# Patient Record
Sex: Female | Born: 1985 | Race: Black or African American | Hispanic: No | Marital: Single | Smoking: Light tobacco smoker
Health system: Southern US, Community
[De-identification: ages and names within clinical notes are randomized; demographics above are authoritative.]

## PROBLEM LIST (undated history)

## (undated) DIAGNOSIS — I10 Essential (primary) hypertension: Secondary | ICD-10-CM

## (undated) DIAGNOSIS — F32A Depression, unspecified: Secondary | ICD-10-CM

## (undated) DIAGNOSIS — F329 Major depressive disorder, single episode, unspecified: Secondary | ICD-10-CM

## (undated) DIAGNOSIS — F319 Bipolar disorder, unspecified: Secondary | ICD-10-CM

## (undated) DIAGNOSIS — K219 Gastro-esophageal reflux disease without esophagitis: Secondary | ICD-10-CM

---

## 2011-02-28 ENCOUNTER — Encounter (HOSPITAL_COMMUNITY): Payer: Self-pay | Admitting: Emergency Medicine

## 2011-02-28 ENCOUNTER — Emergency Department (HOSPITAL_COMMUNITY)
Admission: EM | Admit: 2011-02-28 | Discharge: 2011-02-28 | Disposition: A | Discharge status: Home / Self Care | Payer: Medicaid Other | Attending: Emergency Medicine | Admitting: Emergency Medicine

## 2011-02-28 DIAGNOSIS — N390 Urinary tract infection, site not specified: Secondary | ICD-10-CM

## 2011-02-28 DIAGNOSIS — R110 Nausea: Secondary | ICD-10-CM | POA: Insufficient documentation

## 2011-02-28 DIAGNOSIS — F17200 Nicotine dependence, unspecified, uncomplicated: Secondary | ICD-10-CM | POA: Insufficient documentation

## 2011-02-28 DIAGNOSIS — R1013 Epigastric pain: Secondary | ICD-10-CM | POA: Insufficient documentation

## 2011-02-28 LAB — URINALYSIS, ROUTINE W REFLEX MICROSCOPIC
Glucose, UA: NEGATIVE mg/dL
Ketones, ur: NEGATIVE mg/dL
Nitrite: POSITIVE — AB
Specific Gravity, Urine: 1.025 (ref 1.005–1.030)
pH: 7 (ref 5.0–8.0)

## 2011-02-28 LAB — URINE MICROSCOPIC-ADD ON

## 2011-02-28 LAB — POCT PREGNANCY, URINE: Preg Test, Ur: NEGATIVE

## 2011-02-28 MED ORDER — GI COCKTAIL ~~LOC~~
30.0000 mL | Freq: Once | ORAL | Status: AC
  Administered 2011-02-28: 30 mL via ORAL
  Filled 2011-02-28: qty 30

## 2011-02-28 MED ORDER — FAMOTIDINE 20 MG PO TABS
20.0000 mg | ORAL_TABLET | Freq: Once | ORAL | Status: AC
  Administered 2011-02-28: 20 mg via ORAL
  Filled 2011-02-28: qty 1

## 2011-02-28 MED ORDER — CEPHALEXIN 500 MG PO CAPS: 500.0000 mg | ORAL_CAPSULE | Freq: Four times a day (QID) | ORAL | Status: AC

## 2011-02-28 MED ORDER — NITROFURANTOIN MONOHYD MACRO 100 MG PO CAPS
100.0000 mg | ORAL_CAPSULE | ORAL | Status: AC
  Administered 2011-02-28: 100 mg via ORAL
  Filled 2011-02-28: qty 1

## 2011-02-28 MED ORDER — FAMOTIDINE 20 MG PO TABS: 20.0000 mg | ORAL_TABLET | Freq: Two times a day (BID) | ORAL | Status: DC

## 2011-02-28 MED ORDER — ONDANSETRON 4 MG PO TBDP
4.0000 mg | ORAL_TABLET | Freq: Once | ORAL | Status: AC
  Administered 2011-02-28: 4 mg via ORAL
  Filled 2011-02-28: qty 1

## 2011-02-28 NOTE — ED Notes (Signed)
Pt. Stated, I've had abd. Pain for a week.

## 2011-02-28 NOTE — Discharge Instructions (Signed)

## 2011-02-28 NOTE — ED Provider Notes (Signed)
History     CSN: [id number]  Arrival date & time 02/28/11  1310   First MD Initiated Contact with Patient 02/28/11 1358      Chief Complaint  Patient presents with  . Abdominal Pain    (Consider location/radiation/quality/duration/timing/severity/associated sxs/prior treatment) Patient is a 26 y.o. female presenting with abdominal pain. The history is provided by the patient. No language interpreter was used.  Abdominal Pain The primary symptoms of the illness include abdominal pain, nausea and dysuria. The primary symptoms of the illness do not include fever, fatigue, shortness of breath, vomiting, diarrhea, vaginal discharge or vaginal bleeding. The current episode started more than 2 days ago (1 week). The onset of the illness was gradual. The problem has been gradually worsening.  The pain came on gradually. The abdominal pain has been gradually worsening since its onset. The abdominal pain is located in the epigastric region and suprapubic region. The abdominal pain does not radiate. The abdominal pain is relieved by nothing.  Nausea began 6 to 7 days ago. The nausea is associated with eating. The nausea is exacerbated by food.  The dysuria began 6 to 7 days ago. The discomfort is moderate. The dysuria is associated with frequency. The dysuria is not associated with discharge, hematuria, urgency, dyspareunia or vaginal pain.  The patient states that she believes she is currently not pregnant. The patient has not had a change in bowel habit. Additional symptoms associated with the illness include frequency. Symptoms associated with the illness do not include constipation, urgency, hematuria or back pain.    History reviewed. No pertinent past medical history.  History reviewed. No pertinent past surgical history.  History reviewed. No pertinent family history.  History  Substance Use Topics  . Smoking status: Current Everyday Smoker    Types: Cigarettes  . Smokeless tobacco: Not  on file  . Alcohol Use: No    OB History    Grav Para Term Preterm Abortions TAB SAB Ect Mult Living                  Review of Systems  Constitutional: Negative for fever, activity change, appetite change and fatigue.  HENT: Negative for congestion, sore throat, rhinorrhea, neck pain and neck stiffness.   Respiratory: Negative for cough and shortness of breath.   Cardiovascular: Negative for chest pain and palpitations.  Gastrointestinal: Positive for nausea and abdominal pain. Negative for vomiting, diarrhea and constipation.  Genitourinary: Positive for dysuria and frequency. Negative for urgency, hematuria, flank pain, vaginal bleeding, vaginal discharge, vaginal pain and dyspareunia.  Musculoskeletal: Negative for myalgias, back pain and arthralgias.  Neurological: Negative for dizziness, weakness, light-headedness, numbness and headaches.  All other systems reviewed and are negative.    Allergies  Review of patient's allergies indicates no known allergies.  Home Medications   Current Outpatient Rx  Name Route Sig Dispense Refill  . CEPHALEXIN 500 MG PO CAPS Oral Take 1 capsule (500 mg total) by mouth 4 (four) times daily. 28 capsule 0  . FAMOTIDINE 20 MG PO TABS Oral Take 1 tablet (20 mg total) by mouth 2 (two) times daily. 30 tablet 0    BP 111/64  Pulse 94  Temp(Src) 98.3 F (36.8 C) (Oral)  Resp 16  SpO2 99%  LMP 12/06/2010  Physical Exam  Nursing note and vitals reviewed. Constitutional: She is oriented to person, place, and time. She appears well-developed and well-nourished. No distress.  HENT:  Head: Normocephalic and atraumatic.  Mouth/Throat: Oropharynx is clear and  moist. No oropharyngeal exudate.  Eyes: Conjunctivae and EOM are normal. Pupils are equal, round, and reactive to light.  Neck: Normal range of motion. Neck supple.  Cardiovascular: Normal rate, regular rhythm, normal heart sounds and intact distal pulses.  Exam reveals no gallop and no  friction rub.   No murmur heard. Pulmonary/Chest: Effort normal and breath sounds normal. No respiratory distress.  Abdominal: Soft. Bowel sounds are normal. There is tenderness (epigastric). There is no rebound and no guarding.       No cva tenderness  Musculoskeletal: Normal range of motion. She exhibits no tenderness.  Neurological: She is alert and oriented to person, place, and time. No cranial nerve deficit.  Skin: Skin is warm and dry. No rash noted.    ED Course  Procedures (including critical care time)  Labs Reviewed  URINALYSIS, ROUTINE W REFLEX MICROSCOPIC - Abnormal; Notable for the following:    APPearance CLOUDY (*)    Hgb urine dipstick SMALL (*)    Nitrite POSITIVE (*)    Leukocytes, UA MODERATE (*)    All other components within normal limits  URINE MICROSCOPIC-ADD ON - Abnormal; Notable for the following:    Bacteria, UA MANY (*)    All other components within normal limits  POCT PREGNANCY, URINE   No results found.   1. UTI (lower urinary tract infection)       MDM  Findings consistent with a urinary tract infection. She has some epigastric tenderness consistent with a gastritis type picture. There is no indication for further testing at this time. Symptoms are improved after medications in the emergency department. I chose Keflex after seeing the patient had no insurance. i feel this is the best antibiotic choice.        Dayton Bailiff, MD 02/28/11 [phone]

## 2013-08-17 ENCOUNTER — Emergency Department (HOSPITAL_COMMUNITY)
Admission: EM | Admit: 2013-08-17 | Discharge: 2013-08-17 | Discharge status: Against Medical Advice | Payer: Medicaid Other | Attending: Emergency Medicine | Admitting: Emergency Medicine

## 2013-08-17 ENCOUNTER — Encounter (HOSPITAL_COMMUNITY): Payer: Self-pay | Admitting: Emergency Medicine

## 2013-08-17 DIAGNOSIS — M545 Low back pain, unspecified: Secondary | ICD-10-CM | POA: Diagnosis not present

## 2013-08-17 DIAGNOSIS — I10 Essential (primary) hypertension: Secondary | ICD-10-CM | POA: Diagnosis not present

## 2013-08-17 DIAGNOSIS — F17200 Nicotine dependence, unspecified, uncomplicated: Secondary | ICD-10-CM | POA: Diagnosis not present

## 2013-08-17 DIAGNOSIS — R079 Chest pain, unspecified: Secondary | ICD-10-CM | POA: Insufficient documentation

## 2013-08-17 HISTORY — DX: Bipolar disorder, unspecified: F31.9

## 2013-08-17 HISTORY — DX: Essential (primary) hypertension: I10

## 2013-08-17 NOTE — ED Notes (Signed)
Pt cannot be found

## 2013-08-17 NOTE — ED Notes (Signed)
Patient states started having chest "tightness" on Tuesday and that it has remained constant since.  Patient states that the pain radiates from chest to L arm and low back.  Patient states that she took an ibuprofen yesterday and it seemed to ease up a little.

## 2016-10-04 ENCOUNTER — Emergency Department (HOSPITAL_COMMUNITY)
Admission: EM | Admit: 2016-10-04 | Discharge: 2016-10-04 | Disposition: A | Discharge status: Home / Self Care | Payer: Medicaid Other | Attending: Emergency Medicine | Admitting: Emergency Medicine

## 2016-10-04 ENCOUNTER — Encounter (HOSPITAL_COMMUNITY): Payer: Self-pay | Admitting: Emergency Medicine

## 2016-10-04 DIAGNOSIS — J039 Acute tonsillitis, unspecified: Secondary | ICD-10-CM | POA: Diagnosis not present

## 2016-10-04 DIAGNOSIS — I10 Essential (primary) hypertension: Secondary | ICD-10-CM | POA: Insufficient documentation

## 2016-10-04 DIAGNOSIS — F17210 Nicotine dependence, cigarettes, uncomplicated: Secondary | ICD-10-CM | POA: Insufficient documentation

## 2016-10-04 DIAGNOSIS — J029 Acute pharyngitis, unspecified: Secondary | ICD-10-CM | POA: Diagnosis present

## 2016-10-04 DIAGNOSIS — Z79899 Other long term (current) drug therapy: Secondary | ICD-10-CM | POA: Insufficient documentation

## 2016-10-04 LAB — RAPID STREP SCREEN (MED CTR MEBANE ONLY): Streptococcus, Group A Screen (Direct): NEGATIVE

## 2016-10-04 MED ORDER — DEXAMETHASONE SODIUM PHOSPHATE 10 MG/ML IJ SOLN
10.0000 mg | Freq: Once | INTRAMUSCULAR | Status: AC
  Administered 2016-10-04: 10 mg via INTRAMUSCULAR
  Filled 2016-10-04: qty 1

## 2016-10-04 MED ORDER — KETOROLAC TROMETHAMINE 15 MG/ML IJ SOLN
15.0000 mg | Freq: Once | INTRAMUSCULAR | Status: AC
  Administered 2016-10-04: 15 mg via INTRAMUSCULAR
  Filled 2016-10-04: qty 1

## 2016-10-04 MED ORDER — PENICILLIN V POTASSIUM 500 MG PO TABS: 500.0000 mg | ORAL_TABLET | Freq: Four times a day (QID) | ORAL | 0 refills | Status: AC

## 2016-10-04 NOTE — ED Triage Notes (Signed)
Pt reports sore throat x 2 days

## 2016-10-04 NOTE — Discharge Instructions (Signed)
Take antibiotics as prescribed. Take the entire course of antibiotics even if you are feeling better.  Use tylenol or ibuprofen as needed for fever or chills.  Stay well hydrated with water.  Return to the ER if you develop worsening symptoms, inability to swallow, or any new or concerning symptoms.

## 2016-10-04 NOTE — ED Notes (Signed)
Declined W/C at D/C and was escorted to lobby by RN. 

## 2016-10-04 NOTE — ED Provider Notes (Signed)
Pt seen and evaluated. D/W PA-c. Negative strep swab. Patient with bilateral exudative pharyngitis. She reports pain right greater than left. She is spitting. However she has no frank trismus. She has bilateral intracervical adenopathy. She does not have pain with side to side manipulation of the thyroid cartilage. She does not have a hot potato voice. She she is not dysphonic or worse. She has no posterior cervical nodes.  Her pain is asymmetric but her exam is not. I agree with empiric treatment with penicillin. Since back treatment with Toradol and Decadron. Otherwise Motrin, salt water gargles, and time. Recheck with worsening symptoms.   Tanna Furry, MD 10/04/16 [phone]

## 2016-10-04 NOTE — ED Provider Notes (Signed)
[facility] DEPT Provider Note   CSN: [id number] Arrival date & time: 10/04/16  1301     History   Chief Complaint Chief Complaint  Patient presents with  . Sore Throat    HPI Desiree Howell is a 31 y.o. female presenting with sore throat beginning yesterday.  She states that yesterday she started to develop right sided sore throat. This has worsened, is now on both sides of her throat. She states it hurts when she swallows and talks. She has associated right-sided ear pain. She denies fevers, chills, congestion, difficulty breathing, cough, chest pain, shortness of breath, nausea, vomiting, or abdominal pain. She Is not immunocompromised. She has no other medical history does not take medicines daily. She's not had recent antibiotic use. She denies sick contacts. She has not taken anything to try to improve her symptoms.   HPI  Past Medical History:  Diagnosis Date  . Bipolar 1 disorder ([place])   . Hypertension     There are no active problems to display for this patient.   Past Surgical History:  Procedure Laterality Date  . CESAREAN SECTION      OB History    No data available       Home Medications    Prior to Admission medications   Medication Sig Start Date End Date Taking? Authorizing Provider  famotidine (PEPCID) 20 MG tablet Take 1 tablet (20 mg total) by mouth 2 (two) times daily. 02/28/11 02/28/12  Trisha Mangle, MD  penicillin v potassium (VEETID) 500 MG tablet Take 1 tablet (500 mg total) by mouth 4 (four) times daily. 10/04/16 10/11/16  Abdulkarim Eberlin, PA-C    Family History No family history on file.  Social History Social History  Substance Use Topics  . Smoking status: Current Every Day Smoker    Packs/day: 0.25    Types: Cigarettes  . Smokeless tobacco: Not on file  . Alcohol use Yes     Allergies   Patient has no known allergies.   Review of Systems Review of Systems  Constitutional: Negative for chills and fever.  HENT:  Positive for ear discharge, sore throat and trouble swallowing (hurts, but able to do so). Negative for congestion, drooling, rhinorrhea, sinus pain, sinus pressure and voice change.   Eyes: Negative for pain and discharge.  Respiratory: Negative for cough, chest tightness and shortness of breath.   Cardiovascular: Negative for chest pain.  Gastrointestinal: Negative for abdominal pain, nausea and vomiting.     Physical Exam Updated Vital Signs BP 118/81 (BP Location: Left Arm)   Pulse 79   Temp 99.1 F (37.3 C) (Oral)   Resp 17   SpO2 100%   Physical Exam  Constitutional: She is oriented to person, place, and time. She appears well-developed and well-nourished. No distress.  HENT:  Head: Normocephalic and atraumatic.  Right Ear: Tympanic membrane, external ear and ear canal normal.  Left Ear: Tympanic membrane, external ear and ear canal normal.  Nose: Mucosal edema present.  Mouth/Throat: Uvula is midline and mucous membranes are normal. No trismus in the jaw. No uvula swelling. Tonsillar exudate.  Mild bilateral tonsillar swelling with exudates. Uvula is midline without deviation. Equal palatal rise. No pain under the tongue. Patient is able to swallow and handle secretions.  Eyes: Conjunctivae and EOM are normal. Right eye exhibits no discharge. Left eye exhibits no discharge.  Neck: Normal range of motion.  Cardiovascular: Normal rate, regular rhythm and intact distal pulses.   Pulmonary/Chest: Effort normal and breath sounds  normal. No respiratory distress. She has no wheezes. She exhibits no tenderness.  Patient speaking in full sentences without difficulty. Good air movement in all lung fields.  Abdominal: She exhibits no distension. There is no tenderness.  Musculoskeletal: Normal range of motion.  Lymphadenopathy:    She has cervical adenopathy.  Neurological: She is alert and oriented to person, place, and time.  Skin: Skin is warm. No rash noted.  Psychiatric: She has  a normal mood and affect.  Nursing note and vitals reviewed.    ED Treatments / Results  Labs (all labs ordered are listed, but only abnormal results are displayed) Labs Reviewed  RAPID STREP SCREEN (NOT AT [facility])  CULTURE, GROUP A STREP [facility])    EKG  EKG Interpretation None       Radiology No results found.  Procedures Procedures (including critical care time)  Medications Ordered in ED Medications  dexamethasone (DECADRON) injection 10 mg (10 mg Intramuscular Given 10/04/16 1540)  ketorolac (TORADOL) 15 MG/ML injection 15 mg (15 mg Intramuscular Given 10/04/16 1539)     Initial Impression / Assessment and Plan / ED Course  I have reviewed the triage vital signs and the nursing notes.  Pertinent labs & imaging results that were available during my care of the patient were reviewed by me and considered in my medical decision making (see chart for details).     Patient presenting with one-day history of sore throat. Patient reports she's having difficulty swallowing, but is able to do so. Physical exam shows patient is afebrile not tachycardic. Bilateral tonsillar swelling and patchy exudates without uvula deviation or unequal palate rise. No obvious peritonsillar abscess. Patient speaking in full sentences without difficulty, no signs of airway compromise. Will treat with Decadron and Toradol shots today, and antibiotic prescription. Strep negative. Case discussed with attending, Dr. Jeneen Rinks evaluated the patient Return precautions given. Patient states she understands and agrees to plan.  Patient developed localized swelling after administration of Toradol shot. Ultrasound showed likely hematoma. Ice was applied, patient reported improvement in pain. She denies difficulty breathing, itching or pain elsewhere, or swelling of her tongue, lips, or throat. She is still neurovascularly intact. Dr. Jeneen Rinks re-evaluated the patient. Will observe and reassess.   On reassessment,  patient states her shoulder is improved with ice. Hematoma still present, it has not increased in size. Pt states that her breathing and sore throat is much improved with Decadron. She reports no difficulty swallowing at this time. She continues to deny lip, tongue, or throat swelling. Discussed likely course of shoulder pain and bruising over the next several weeks. Discussed patient to continue to apply ice for symptom control. Return precautions given. Patient states she understands.   Final Clinical Impressions(s) / ED Diagnoses   Final diagnoses:  Tonsillitis    New Prescriptions Discharge Medication List as of 10/04/2016  3:32 PM    START taking these medications   Details  penicillin v potassium (VEETID) 500 MG tablet Take 1 tablet (500 mg total) by mouth 4 (four) times daily., Starting Sun 10/04/2016, Until Sun 10/11/2016, Print         Christean Silvestri, PA-C 10/04/16 1750    Tanna Furry, MD 10/12/16 [phone]

## 2016-10-06 LAB — CULTURE, GROUP A STREP (THRC)

## 2016-10-18 ENCOUNTER — Encounter (HOSPITAL_COMMUNITY): Payer: Self-pay

## 2016-10-18 ENCOUNTER — Emergency Department (HOSPITAL_COMMUNITY)
Admission: EM | Admit: 2016-10-18 | Discharge: 2016-10-18 | Disposition: A | Discharge status: Home / Self Care | Payer: Medicaid Other | Attending: Emergency Medicine | Admitting: Emergency Medicine

## 2016-10-18 ENCOUNTER — Emergency Department (HOSPITAL_COMMUNITY): Payer: Medicaid Other

## 2016-10-18 DIAGNOSIS — F17210 Nicotine dependence, cigarettes, uncomplicated: Secondary | ICD-10-CM | POA: Diagnosis not present

## 2016-10-18 DIAGNOSIS — M7591 Shoulder lesion, unspecified, right shoulder: Secondary | ICD-10-CM | POA: Insufficient documentation

## 2016-10-18 DIAGNOSIS — I10 Essential (primary) hypertension: Secondary | ICD-10-CM | POA: Diagnosis not present

## 2016-10-18 DIAGNOSIS — M25511 Pain in right shoulder: Secondary | ICD-10-CM | POA: Diagnosis present

## 2016-10-18 LAB — COMPREHENSIVE METABOLIC PANEL
ALK PHOS: 53 U/L (ref 38–126)
ALT: 16 U/L (ref 14–54)
ANION GAP: 6 (ref 5–15)
AST: 17 U/L (ref 15–41)
Albumin: 3.3 g/dL — ABNORMAL LOW (ref 3.5–5.0)
BUN: 11 mg/dL (ref 6–20)
CO2: 25 mmol/L (ref 22–32)
Calcium: 8.6 mg/dL — ABNORMAL LOW (ref 8.9–10.3)
Chloride: 104 mmol/L (ref 101–111)
Creatinine, Ser: 0.61 mg/dL (ref 0.44–1.00)
GLUCOSE: 121 mg/dL — AB (ref 65–99)
Potassium: 3.9 mmol/L (ref 3.5–5.1)
SODIUM: 135 mmol/L (ref 135–145)
Total Bilirubin: 0.7 mg/dL (ref 0.3–1.2)
Total Protein: 6.5 g/dL (ref 6.5–8.1)

## 2016-10-18 LAB — I-STAT BETA HCG BLOOD, ED (MC, WL, AP ONLY)

## 2016-10-18 LAB — CBC WITH DIFFERENTIAL/PLATELET
Basophils Absolute: 0 10*3/uL (ref 0.0–0.1)
Basophils Relative: 0 %
Eosinophils Absolute: 0.2 10*3/uL (ref 0.0–0.7)
Eosinophils Relative: 3 %
HEMATOCRIT: 38.8 % (ref 36.0–46.0)
Hemoglobin: 12.8 g/dL (ref 12.0–15.0)
LYMPHS PCT: 39 %
Lymphs Abs: 2.4 10*3/uL (ref 0.7–4.0)
MCH: 31.7 pg (ref 26.0–34.0)
MCHC: 33 g/dL (ref 30.0–36.0)
MCV: 96 fL (ref 78.0–100.0)
MONO ABS: 0.4 10*3/uL (ref 0.1–1.0)
MONOS PCT: 6 %
NEUTROS ABS: 3.1 10*3/uL (ref 1.7–7.7)
Neutrophils Relative %: 52 %
Platelets: 458 10*3/uL — ABNORMAL HIGH (ref 150–400)
RBC: 4.04 MIL/uL (ref 3.87–5.11)
RDW: 12.4 % (ref 11.5–15.5)
WBC: 6 10*3/uL (ref 4.0–10.5)

## 2016-10-18 LAB — I-STAT CG4 LACTIC ACID, ED
LACTIC ACID, VENOUS: 1.07 mmol/L (ref 0.5–1.9)
Lactic Acid, Venous: 0.82 mmol/L (ref 0.5–1.9)

## 2016-10-18 MED ORDER — TRAMADOL HCL 50 MG PO TABS: 50.0000 mg | ORAL_TABLET | Freq: Four times a day (QID) | ORAL | 0 refills | Status: DC | PRN

## 2016-10-18 MED ORDER — TRAMADOL HCL 50 MG PO TABS
50.0000 mg | ORAL_TABLET | Freq: Once | ORAL | Status: AC
  Administered 2016-10-18: 50 mg via ORAL
  Filled 2016-10-18: qty 1

## 2016-10-18 NOTE — ED Triage Notes (Signed)
Patient complains of right shoulder swelling with pain x several days. States that this got much worse following injections about 1-2 weeks ago in ED. Large knot noted to right shoulder, pain with any ROM

## 2016-10-18 NOTE — ED Provider Notes (Signed)
[facility] DEPT Provider Note   CSN: [id number] Arrival date & time: 10/18/16  1233     History   Chief Complaint Chief Complaint  Patient presents with  . shoulder swelling, abd. pain    HPI Nikyah Howell is a 31 y.o. female.  HPI Patient presents with her pain and swelling in her right shoulder. She notes that she has had pain in the area since falling off truck several years ago. However, over the past days in particular pain has become worse, and there is a possible increase in size of the right shoulder lesion. Pain is worse with any motion of the arm, radiates down the arm. Patient is minimally improved with Tylenol. No distal loss of sensation or strength. No neck pain, no headache, fever, no chills, no skin color changes. Patient was seen here within the past month, treated for strep throat.  Past Medical History:  Diagnosis Date  . Bipolar 1 disorder ([place])   . Hypertension     There are no active problems to display for this patient.   Past Surgical History:  Procedure Laterality Date  . CESAREAN SECTION      OB History    No data available       Home Medications    Prior to Admission medications   Medication Sig Start Date End Date Taking? Authorizing Provider  famotidine (PEPCID) 20 MG tablet Take 1 tablet (20 mg total) by mouth 2 (two) times daily. Patient not taking: Reported on 10/18/2016 02/28/11 10/18/16  Trisha Mangle, MD    Family History No family history on file.  Social History Social History  Substance Use Topics  . Smoking status: Current Every Day Smoker    Packs/day: 0.25    Types: Cigarettes  . Smokeless tobacco: Not on file  . Alcohol use Yes     Allergies   Patient has no known allergies.   Review of Systems Review of Systems  Constitutional:       Per HPI, otherwise negative  HENT:       Per HPI, otherwise negative  Respiratory:       Per HPI, otherwise negative  Cardiovascular:       Per HPI,  otherwise negative  Gastrointestinal: Negative for vomiting.  Endocrine:       Negative aside from HPI  Genitourinary:       Neg aside from HPI   Musculoskeletal:       Per HPI, otherwise negative  Skin: Negative.   Neurological: Negative for syncope.     Physical Exam Updated Vital Signs BP 133/88 (BP Location: Left Arm)   Pulse 79   Temp 98.7 F (37.1 C) (Oral)   Resp 17   SpO2 100%   Physical Exam  Constitutional: She is oriented to person, place, and time. She appears well-developed and well-nourished. No distress.  HENT:  Head: Normocephalic and atraumatic.  Eyes: Conjunctivae and EOM are normal.  Cardiovascular: Normal rate and regular rhythm.   Pulmonary/Chest: Effort normal and breath sounds normal. No stridor. No respiratory distress.  Abdominal: She exhibits no distension.  Musculoskeletal: She exhibits no edema.       Right elbow: Normal.      Right wrist: Normal.       Arms: Neurological: She is alert and oriented to person, place, and time. No cranial nerve deficit.  Skin: Skin is warm and dry.  Psychiatric: She has a normal mood and affect.  Nursing note and vitals reviewed.  ED Treatments / Results  Labs (all labs ordered are listed, but only abnormal results are displayed) Labs Reviewed  COMPREHENSIVE METABOLIC PANEL - Abnormal; Notable for the following:       Result Value   Glucose, Bld 121 (*)    Calcium 8.6 (*)    Albumin 3.3 (*)    All other components within normal limits  CBC WITH DIFFERENTIAL/PLATELET - Abnormal; Notable for the following:    Platelets 458 (*)    All other components within normal limits  I-STAT CG4 LACTIC ACID, ED  POC URINE PREG, ED  I-STAT BETA HCG BLOOD, ED ([facility], [facility], [facility] ONLY)     Radiology Ct Shoulder Right Wo Contrast  Result Date: 10/18/2016 CLINICAL DATA:  Palpable right shoulder mass. EXAM: CT OF THE UPPER RIGHT EXTREMITY WITHOUT CONTRAST TECHNIQUE: Multidetector CT imaging of the upper right extremity  was performed according to the standard protocol. COMPARISON:  None. FINDINGS: Bones/Joint/Cartilage Normal osseous mineralization. Corticated curvilinear lucency through the distal acromion. Ligaments Suboptimally assessed by CT. Muscles and Tendons Grossly intact. Soft tissues There is a fat density circumscribed mass, which corresponds to the area of palpable concern adjacent to the right acromioclavicular joint which measures 7.5 by 7.9 by 7.4 cm. No pathologically enlarged by CT criteria lymph nodes. IMPRESSION: Corticated curvilinear lucency through the distal acromion may represent a chronic nonunion fracture or a congenital variant. The area of palpable concern superficial to the acromioclavicular joint corresponds to a 7.9 cm fat density circumscribed mass. Differential diagnosis includes lipoma versus liposarcoma. Electronically Signed   By: Fidela Salisbury M.D.   On: 10/18/2016 18:19    Procedures Procedures (including critical care time)  Medications Ordered in ED Medications  traMADol (ULTRAM) tablet 50 mg (not administered)     Initial Impression / Assessment and Plan / ED Course  I have reviewed the triage vital signs and the nursing notes.  Pertinent labs & imaging results that were available during my care of the patient were reviewed by me and considered in my medical decision making (see chart for details).   Chart review performed notable for recent evaluation, treatment for strep throat.  6:47 PM Patient awake and alert, in no distress. I discussed all findings including CT results with the patient.  With no evidence for intra-articular issue, with suspicion for lipoma versus liposarcoma, I discussed importance of following up with orthopedist. With otherwise reassuring physical exam, vitals, labs, patient is appropriate for outpatient follow-up.  Final Clinical Impressions(s) / ED Diagnoses  Shoulder lesion, right, initial encounter   Carmin Muskrat,  MD 10/18/16 [phone]

## 2016-10-18 NOTE — Discharge Instructions (Signed)
As discussed, today's evaluation has been generally reassuring. The lesion on your shoulder is likely a lipoma, though additional evaluation is necessary. His important that you follow-up with our orthopedic colleagues.  Monitor your condition carefully, and do not hesitate to return here if you develop new, or concerning changes in your condition.

## 2016-10-20 ENCOUNTER — Telehealth: Payer: Self-pay

## 2016-10-20 NOTE — Telephone Encounter (Signed)
Patient made appointment to be seen.

## 2016-10-22 ENCOUNTER — Ambulatory Visit (INDEPENDENT_AMBULATORY_CARE_PROVIDER_SITE_OTHER): Payer: Medicaid Other | Admitting: Orthopaedic Surgery

## 2016-10-22 ENCOUNTER — Encounter (INDEPENDENT_AMBULATORY_CARE_PROVIDER_SITE_OTHER): Payer: Self-pay | Admitting: Orthopaedic Surgery

## 2016-10-22 VITALS — BP 108/71 | HR 70 | Temp 98.3°F | Resp 14 | Ht 61.0 in | Wt 236.0 lb

## 2016-10-22 DIAGNOSIS — M25511 Pain in right shoulder: Secondary | ICD-10-CM

## 2016-10-22 DIAGNOSIS — G8929 Other chronic pain: Secondary | ICD-10-CM | POA: Diagnosis not present

## 2016-10-22 NOTE — Progress Notes (Deleted)
   Office Visit Note   Patient: Desiree Howell           Date of Birth: 14-Jan-1985           MRN: [id number] Visit Date: 10/22/2016              Requested by: No referring provider defined for this encounter. PCP: Patient, No Pcp Per   Assessment & Plan: Visit Diagnoses: No diagnosis found.  Plan: ***  Follow-Up Instructions: No Follow-up on file.   Orders:  No orders of the defined types were placed in this encounter.  No orders of the defined types were placed in this encounter.     Procedures: No procedures performed   Clinical Data: No additional findings.   Subjective: Chief Complaint  Patient presents with  . Right Shoulder - Pain, Edema    [name] is a 31 y o that presents with Right shoulder pain. At age 31, she fell off back of truck with R elbow dislocated, reduced by grandparent.2 yrs. ago She noticed a small "bum" on the shoulder. Currently the shoulder is ready, swollen and painful    HPI  Review of Systems  Constitutional: Negative for chills, fatigue and fever.  Eyes: Negative for itching.  Respiratory: Negative for chest tightness and shortness of breath.   Cardiovascular: Negative for chest pain, palpitations and leg swelling.  Gastrointestinal: Negative for blood in stool, constipation and diarrhea.  Endocrine: Negative for polyuria.  Genitourinary: Negative for dysuria.  Musculoskeletal: Positive for neck pain. Negative for back pain and joint swelling.  Allergic/Immunologic: Negative for immunocompromised state.  Neurological: Negative for dizziness and numbness.  Hematological: Does not bruise/bleed easily.  Psychiatric/Behavioral: The patient is not nervous/anxious.      Objective: Vital Signs: BP 108/71   Pulse 70   Resp 14   Ht 5\' 1"  (1.549 m)   Wt 236 lb (107 kg)   LMP 10/07/2016   BMI 44.59 kg/m   Physical Exam  Ortho Exam  Specialty Comments:  No specialty comments available.  Imaging: No results found.   PMFS  History: There are no active problems to display for this patient.  Past Medical History:  Diagnosis Date  . Bipolar 1 disorder ([place])   . Hypertension     No family history on file.  Past Surgical History:  Procedure Laterality Date  . CESAREAN SECTION     Social History   Occupational History  . Not on file.   Social History Main Topics  . Smoking status: Light Tobacco Smoker    Types: Cigarettes  . Smokeless tobacco: Never Used  . Alcohol use Yes  . Drug use: No  . Sexual activity: Not on file

## 2016-10-22 NOTE — Progress Notes (Signed)
Office Visit Note   Patient: Desiree Howell           Date of Birth: June 19, 1985           MRN: [id number] Visit Date: 10/22/2016              Requested by: No referring provider defined for this encounter. PCP: Patient, No Pcp Per   Assessment & Plan: Visit Diagnoses:  1. Chronic right shoulder pain     Plan:Long discussion with Desiree Howell regarding the mass of the right shoulder and diagnostic possibilities. We will schedule an excisional biopsy of mass at [facility] operating [facility]. I discussed the outpatient nature the surgery, the incision..Period Of immobilization and necessary time out of work.  Follow-Up Instructions: Return will schedule excision mass right shoulder.   Orders:  No orders of the defined types were placed in this encounter.  No orders of the defined types were placed in this encounter.     Procedures: No procedures performed   Clinical Data: No additional findings.   Subjective: Chief Complaint  Patient presents with  . Right Shoulder - Pain, Edema    Desiree Howell is a 31 y o that presents with Right shoulder pain. At age 40, she fell off back of truck with R elbow dislocated, reduced by grandparent.2 yrs. ago She noticed a small "bum" on the shoulder. Currently the shoulder is ready, swollen and painful  Desiree Howell relates a progressively enlarging mass of the right shoulder over the past 2 years. She notes having had an injury to her shoulder 20 years ago and is not sure if that is at all related she apparently dislocated her elbow and injured her shoulder at that time that all of her injuries resolved without a further problems. She began to experience painful mass in the anterior aspect superior aspect of her right shoulder several years ago notes it has slowly enlarged. She was recently seen in the emergency room with a CT scan measuring 7.5 x 7.4 cm mass superior to the acromium and clipped distal clavicle the right shoulder. The differential  diagnosis included lipoma versus liposarcoma. The mass is well encapsulated. There were no other signs of malignancy. He is had difficulty sleeping on her shoulder and of course there is a concern about the appearance. Occasionally experiences some tingling in her hand. She's had 5 prior pregnancies and notes never having problems with numbness during any of those pregnancies. Denies knee problems on the left. Not having any neck problems  HPI  Review of Systems   Objective: Vital Signs: BP 108/71   Pulse 70   Temp 98.3 F (36.8 C)   Resp 14   Ht 5\' 1"  (1.549 m)   Wt 236 lb (107 kg)   LMP 10/07/2016   BMI 44.59 kg/m   Physical Exam  Ortho Exam awake alert and oriented 3 comfortable sitting in very pleasant. Large mass in the dorsum of her right shoulder that measures about 8-9 cm in diameter. It is firm. It didn't appear to be the least bit fluctuant. There is no change. Neurovascular exam appears to be intact. No neck pain. The mass is freely mobile. There is some tenderness. No impingement. Biceps intact  Specialty Comments:  No specialty comments available.  Imaging: No results found.   PMFS History: There are no active problems to display for this patient.  Past Medical History:  Diagnosis Date  . Bipolar 1 disorder ([place])   . Hypertension  History reviewed. No pertinent family history.  Past Surgical History:  Procedure Laterality Date  . CESAREAN SECTION     Social History   Occupational History  . Not on file.   Social History Main Topics  . Smoking status: Light Tobacco Smoker    Types: Cigarettes  . Smokeless tobacco: Never Used  . Alcohol use Yes  . Drug use: No  . Sexual activity: Not on file     Garald Balding, MD   Note - This record has been created using [employer].  Chart creation errors have been sought, but may not always  have been located. Such creation errors do not reflect on  the standard of medical care.

## 2016-11-04 NOTE — H&P (Signed)
Desiree Fears, MD   Biagio Borg, PA-C [address] [place], [place]  [address]                             [phone]   [place] MRN:  [id number] DOB/SEX:  Jun 07, 1985/female  CHIEF COMPLAINT:  Painful right shoulder mass  HISTORY: Desiree Howell relates a progressively enlarging mass of the right shoulder over the past 2 years. She notes having had an injury to her shoulder 20 years ago and is not sure if that is at all related she apparently dislocated her elbow and injured her shoulder at that time that all of her injuries resolved without a further problems. She began to experience painful mass in the anterior aspect superior aspect of her right shoulder several years ago notes it has slowly enlarged. She was recently seen in the emergency room with a CT scan measuring 7.5 x 7.4 cm mass superior to the acromium and clipped distal clavicle the right shoulder. The differential diagnosis included lipoma versus liposarcoma. The mass is well encapsulated. There were no other signs of malignancy. He is had difficulty sleeping on her shoulder and of course there is a concern about the appearance. Occasionally experiences some tingling in her hand.   PAST MEDICAL HISTORY: There are no active problems to display for this patient.  Past Medical History:  Diagnosis Date  . Bipolar 1 disorder ([place])   . Hypertension    Past Surgical History:  Procedure Laterality Date  . CESAREAN SECTION       MEDICATIONS:   No prescriptions prior to admission.    ALLERGIES:  No Known Allergies  REVIEW OF SYSTEMS:  ROS   FAMILY HISTORY:  No family history on file.  SOCIAL HISTORY:   Social History  Substance Use Topics  . Smoking status: Light Tobacco Smoker    Types: Cigarettes  . Smokeless tobacco: Never Used  . Alcohol use Yes      EXAMINATION: Vital signs in last 24 hours:    Head is normocephalic.   Eyes:  Pupils equal, round and  reactive to light and accommodation.  Extraocular intact. ENT: Ears, nose, and throat were benign.   Neck: supple, no bruits were noted.   Chest: good expansion.   Lungs: essentially clear.   Cardiac: regular rhythm and rate, normal S1, S2.  No murmurs appreciated. Pulses :  2+ bilateral and symmetric in upper extremities. Abdomen is scaphoid, soft, nontender, no masses palpable, normal bowel sounds present. CNS:  He is oriented x3 and cranial nerves II-XII grossly intact. Breast, rectal, and genital exams: not performed and not indicated for an orthopedic evaluation. Musculoskeletal:  Large mass in the dorsum of her right shoulder that measures about 8-9 cm in diameter. It is firm. It didn't appear to be the least bit fluctuant   Imaging Review CT OF THE UPPER RIGHT EXTREMITY WITHOUT CONTRAST  TECHNIQUE: Multidetector CT imaging of the upper right extremity was performed according to the standard protocol.  COMPARISON:  None.  FINDINGS: Bones/Joint/Cartilage  Normal osseous mineralization. Corticated curvilinear lucency through the distal acromion.  Ligaments  Suboptimally assessed by CT.  Muscles and Tendons  Grossly intact.  Soft tissues  There is a fat density circumscribed mass, which corresponds to the area of palpable concern adjacent to the right acromioclavicular joint which measures 7.5 by 7.9 by 7.4 cm. No pathologically enlarged by CT criteria lymph  nodes.  IMPRESSION: Corticated curvilinear lucency through the distal acromion may represent a chronic nonunion fracture or a congenital variant.  The area of palpable concern superficial to the acromioclavicular joint corresponds to a 7.9 cm fat density circumscribed mass. Differential diagnosis includes lipoma versus liposarcoma.  ASSESSMENT: right shoulder mass  Past Medical History:  Diagnosis Date  . Bipolar 1 disorder ([place])   . Hypertension     PLAN: Plan for right mass excision  and biopsy  The procedure,  risks, and benefits of surgery were presented and reviewed. The risks including but not limited to infection, blood clots, vascular and nerve injury, stiffness,  among others were discussed. The patient acknowledged the explanation, agreed to proceed.   Mike Craze [place], [place] [phone]  11/04/2016 5:03 PM

## 2016-11-06 NOTE — Pre-Procedure Instructions (Signed)
Desiree Howell  [id number]      [id number] [address], [place] [address] [place] [address] Phone: [phone] Fax: [phone]    Your procedure is scheduled on November 6  Report to [facility] Admitting at 0800 A.M.  Call this number if you have problems the morning of surgery:  [phone]   Remember:  Do not eat food or drink liquids after midnight.  Continue all other medications as directed by your physician except follow these medication instructions before surgery   Take these medicines the morning of surgery with A SIP OF WATER  acetaminophen (TYLENOL)  7 days prior to surgery STOP taking any Aspirin (unless otherwise instructed by your surgeon), Aleve, Naproxen, Ibuprofen, Motrin, Advil, Goody's, BC's, all herbal medications, fish oil, and all vitamins    Do not wear jewelry, make-up or nail polish.  Do not wear lotions, powders, or perfumes, or deoderant.  Do not shave 48 hours prior to surgery.    Do not bring valuables to the hospital.  [facility] is not responsible for any belongings or valuables.  Contacts, dentures or bridgework may not be worn into surgery.  Leave your suitcase in the car.  After surgery it may be brought to your room.  For patients admitted to the hospital, discharge time will be determined by your treatment team.  Patients discharged the day of surgery will not be allowed to drive home.    Special instructions:   [facility]- Preparing For Surgery  Before surgery, you can play an important role. Because skin is not sterile, your skin needs to be as free of germs as possible. You can reduce the number of germs on your skin by washing with CHG (chlorahexidine gluconate) Soap before surgery.  CHG is an antiseptic cleaner which kills germs and bonds with the skin to continue killing germs even after washing.  Please do not use if you have an allergy to CHG or  antibacterial soaps. If your skin becomes reddened/irritated stop using the CHG.  Do not shave (including legs and underarms) for at least 48 hours prior to first CHG shower. It is OK to shave your face.  Please follow these instructions carefully.   1. Shower the NIGHT BEFORE SURGERY and the MORNING OF SURGERY with CHG.   2. If you chose to wash your hair, wash your hair first as usual with your normal shampoo.  3. After you shampoo, rinse your hair and body thoroughly to remove the shampoo.  4. Use CHG as you would any other liquid soap. You can apply CHG directly to the skin and wash gently with a scrungie or a clean washcloth.   5. Apply the CHG Soap to your body ONLY FROM THE NECK DOWN.  Do not use on open wounds or open sores. Avoid contact with your eyes, ears, mouth and genitals (private parts). Wash Face and genitals (private parts)  with your normal soap.  6. Wash thoroughly, paying special attention to the area where your surgery will be performed.  7. Thoroughly rinse your body with warm water from the neck down.  8. DO NOT shower/wash with your normal soap after using and rinsing off the CHG Soap.  9. Pat yourself dry with a CLEAN TOWEL.  10. Wear CLEAN PAJAMAS to bed the night before surgery, wear comfortable clothes the morning of surgery  11. Place CLEAN SHEETS on your bed the night of your first shower  and DO NOT SLEEP WITH PETS.    Day of Surgery: Do not apply any deodorants/lotions. Please wear clean clothes to the hospital/surgery center.      Please read over the following fact sheets that you were given.

## 2016-11-09 ENCOUNTER — Encounter (HOSPITAL_COMMUNITY): Payer: Self-pay

## 2016-11-09 ENCOUNTER — Encounter (HOSPITAL_COMMUNITY)
Admission: RE | Admit: 2016-11-09 | Discharge: 2016-11-09 | Disposition: A | Discharge status: Home / Self Care | Payer: Medicaid Other | Source: Ambulatory Visit | Attending: Orthopaedic Surgery | Admitting: Orthopaedic Surgery

## 2016-11-09 DIAGNOSIS — Z01812 Encounter for preprocedural laboratory examination: Secondary | ICD-10-CM | POA: Insufficient documentation

## 2016-11-09 DIAGNOSIS — Z9889 Other specified postprocedural states: Secondary | ICD-10-CM | POA: Diagnosis not present

## 2016-11-09 DIAGNOSIS — F319 Bipolar disorder, unspecified: Secondary | ICD-10-CM | POA: Diagnosis not present

## 2016-11-09 DIAGNOSIS — F17210 Nicotine dependence, cigarettes, uncomplicated: Secondary | ICD-10-CM | POA: Insufficient documentation

## 2016-11-09 DIAGNOSIS — M25811 Other specified joint disorders, right shoulder: Secondary | ICD-10-CM | POA: Diagnosis not present

## 2016-11-09 DIAGNOSIS — I10 Essential (primary) hypertension: Secondary | ICD-10-CM | POA: Insufficient documentation

## 2016-11-09 HISTORY — DX: Gastro-esophageal reflux disease without esophagitis: K21.9

## 2016-11-09 HISTORY — DX: Major depressive disorder, single episode, unspecified: F32.9

## 2016-11-09 HISTORY — DX: Depression, unspecified: F32.A

## 2016-11-09 LAB — HCG, SERUM, QUALITATIVE: PREG SERUM: NEGATIVE

## 2016-11-09 LAB — CBC
HEMATOCRIT: 36.3 % (ref 36.0–46.0)
HEMOGLOBIN: 12 g/dL (ref 12.0–15.0)
MCH: 32.3 pg (ref 26.0–34.0)
MCHC: 33.1 g/dL (ref 30.0–36.0)
MCV: 97.6 fL (ref 78.0–100.0)
Platelets: 407 10*3/uL — ABNORMAL HIGH (ref 150–400)
RBC: 3.72 MIL/uL — AB (ref 3.87–5.11)
RDW: 12.8 % (ref 11.5–15.5)
WBC: 7.8 10*3/uL (ref 4.0–10.5)

## 2016-11-10 ENCOUNTER — Encounter (HOSPITAL_COMMUNITY): Payer: Self-pay | Admitting: Surgery

## 2016-11-10 ENCOUNTER — Ambulatory Visit (HOSPITAL_COMMUNITY): Payer: Medicaid Other | Admitting: Certified Registered"

## 2016-11-10 ENCOUNTER — Encounter (HOSPITAL_COMMUNITY)
Admission: RE | Disposition: A | Discharge status: Home / Self Care | Payer: Self-pay | Source: Ambulatory Visit | Attending: Orthopaedic Surgery

## 2016-11-10 ENCOUNTER — Other Ambulatory Visit: Payer: Self-pay

## 2016-11-10 ENCOUNTER — Ambulatory Visit (HOSPITAL_COMMUNITY)
Admission: RE | Admit: 2016-11-10 | Discharge: 2016-11-10 | Disposition: A | Discharge status: Home / Self Care | Payer: Medicaid Other | Source: Ambulatory Visit | Attending: Orthopaedic Surgery | Admitting: Orthopaedic Surgery

## 2016-11-10 DIAGNOSIS — R2230 Localized swelling, mass and lump, unspecified upper limb: Secondary | ICD-10-CM | POA: Diagnosis present

## 2016-11-10 DIAGNOSIS — R202 Paresthesia of skin: Secondary | ICD-10-CM | POA: Insufficient documentation

## 2016-11-10 DIAGNOSIS — D1721 Benign lipomatous neoplasm of skin and subcutaneous tissue of right arm: Secondary | ICD-10-CM | POA: Diagnosis present

## 2016-11-10 DIAGNOSIS — R2231 Localized swelling, mass and lump, right upper limb: Secondary | ICD-10-CM | POA: Diagnosis present

## 2016-11-10 DIAGNOSIS — F17210 Nicotine dependence, cigarettes, uncomplicated: Secondary | ICD-10-CM | POA: Insufficient documentation

## 2016-11-10 DIAGNOSIS — I10 Essential (primary) hypertension: Secondary | ICD-10-CM | POA: Diagnosis not present

## 2016-11-10 DIAGNOSIS — F319 Bipolar disorder, unspecified: Secondary | ICD-10-CM | POA: Diagnosis not present

## 2016-11-10 HISTORY — PX: EXCISION MASS UPPER EXTREMETIES: SHX6704

## 2016-11-10 SURGERY — EXCISION MASS UPPER EXTREMITIES
Anesthesia: Regional | Site: Shoulder | Laterality: Right

## 2016-11-10 MED ORDER — LACTATED RINGERS IV SOLN
INTRAVENOUS | Status: DC
  Administered 2016-11-10 (×2): via INTRAVENOUS

## 2016-11-10 MED ORDER — DIPHENHYDRAMINE HCL 50 MG/ML IJ SOLN
INTRAMUSCULAR | Status: DC | PRN
  Administered 2016-11-10: 25 mg via INTRAVENOUS

## 2016-11-10 MED ORDER — SODIUM CHLORIDE 0.9 % IR SOLN
Status: DC | PRN
  Administered 2016-11-10: 1000 mL

## 2016-11-10 MED ORDER — MIDAZOLAM HCL 2 MG/2ML IJ SOLN
INTRAMUSCULAR | Status: AC
  Filled 2016-11-10: qty 2

## 2016-11-10 MED ORDER — PHENYLEPHRINE 40 MCG/ML (10ML) SYRINGE FOR IV PUSH (FOR BLOOD PRESSURE SUPPORT)
PREFILLED_SYRINGE | INTRAVENOUS | Status: AC
  Filled 2016-11-10: qty 10

## 2016-11-10 MED ORDER — PROPOFOL 10 MG/ML IV BOLUS
INTRAVENOUS | Status: DC | PRN
  Administered 2016-11-10: 200 mg via INTRAVENOUS

## 2016-11-10 MED ORDER — ONDANSETRON HCL 4 MG/2ML IJ SOLN
INTRAMUSCULAR | Status: DC | PRN
  Administered 2016-11-10 (×2): 4 mg via INTRAVENOUS

## 2016-11-10 MED ORDER — DEXAMETHASONE SODIUM PHOSPHATE 10 MG/ML IJ SOLN
INTRAMUSCULAR | Status: DC | PRN
  Administered 2016-11-10: 10 mg via INTRAVENOUS

## 2016-11-10 MED ORDER — HYDROCODONE-ACETAMINOPHEN 5-325 MG PO TABS: 1.0000 | ORAL_TABLET | ORAL | 0 refills | Status: DC | PRN

## 2016-11-10 MED ORDER — LIDOCAINE 2% (20 MG/ML) 5 ML SYRINGE
INTRAMUSCULAR | Status: DC | PRN
  Administered 2016-11-10: 60 mg via INTRAVENOUS

## 2016-11-10 MED ORDER — LIDOCAINE 2% (20 MG/ML) 5 ML SYRINGE
INTRAMUSCULAR | Status: AC
  Filled 2016-11-10: qty 5

## 2016-11-10 MED ORDER — ROCURONIUM BROMIDE 10 MG/ML (PF) SYRINGE
PREFILLED_SYRINGE | INTRAVENOUS | Status: AC
  Filled 2016-11-10: qty 5

## 2016-11-10 MED ORDER — CHLORHEXIDINE GLUCONATE 4 % EX LIQD: 60.0000 mL | Freq: Every day | CUTANEOUS | Status: DC

## 2016-11-10 MED ORDER — CEFAZOLIN SODIUM-DEXTROSE 2-4 GM/100ML-% IV SOLN
INTRAVENOUS | Status: AC
  Filled 2016-11-10: qty 100

## 2016-11-10 MED ORDER — ROCURONIUM BROMIDE 10 MG/ML (PF) SYRINGE
PREFILLED_SYRINGE | INTRAVENOUS | Status: DC | PRN
  Administered 2016-11-10: 60 mg via INTRAVENOUS

## 2016-11-10 MED ORDER — FENTANYL CITRATE (PF) 100 MCG/2ML IJ SOLN
INTRAMUSCULAR | Status: AC
  Administered 2016-11-10: 100 ug via INTRAVENOUS
  Filled 2016-11-10: qty 2

## 2016-11-10 MED ORDER — SODIUM CHLORIDE 0.9 % IV SOLN: INTRAVENOUS | Status: DC

## 2016-11-10 MED ORDER — GLYCOPYRROLATE 0.2 MG/ML IJ SOLN
INTRAMUSCULAR | Status: DC | PRN
  Administered 2016-11-10: 0.1 mg via INTRAVENOUS

## 2016-11-10 MED ORDER — PHENYLEPHRINE 40 MCG/ML (10ML) SYRINGE FOR IV PUSH (FOR BLOOD PRESSURE SUPPORT)
PREFILLED_SYRINGE | INTRAVENOUS | Status: DC | PRN
  Administered 2016-11-10: 80 ug via INTRAVENOUS
  Administered 2016-11-10: 120 ug via INTRAVENOUS
  Administered 2016-11-10: 160 ug via INTRAVENOUS
  Administered 2016-11-10 (×3): 80 ug via INTRAVENOUS
  Administered 2016-11-10: 120 ug via INTRAVENOUS
  Administered 2016-11-10: 80 ug via INTRAVENOUS

## 2016-11-10 MED ORDER — CEFAZOLIN SODIUM-DEXTROSE 2-4 GM/100ML-% IV SOLN
2.0000 g | INTRAVENOUS | Status: AC
  Administered 2016-11-10: 2 g via INTRAVENOUS

## 2016-11-10 MED ORDER — MIDAZOLAM HCL 2 MG/2ML IJ SOLN
2.0000 mg | Freq: Once | INTRAMUSCULAR | Status: AC
  Administered 2016-11-10: 2 mg via INTRAVENOUS

## 2016-11-10 MED ORDER — SUGAMMADEX SODIUM 500 MG/5ML IV SOLN
INTRAVENOUS | Status: AC
  Filled 2016-11-10: qty 5

## 2016-11-10 MED ORDER — ONDANSETRON HCL 4 MG/2ML IJ SOLN
INTRAMUSCULAR | Status: AC
  Filled 2016-11-10: qty 2

## 2016-11-10 MED ORDER — LACTATED RINGERS IV SOLN: INTRAVENOUS | Status: DC

## 2016-11-10 MED ORDER — BUPIVACAINE-EPINEPHRINE (PF) 0.5% -1:200000 IJ SOLN
INTRAMUSCULAR | Status: DC | PRN
  Administered 2016-11-10: 25 mL via PERINEURAL

## 2016-11-10 MED ORDER — MIDAZOLAM HCL 2 MG/2ML IJ SOLN
INTRAMUSCULAR | Status: AC
  Administered 2016-11-10: 2 mg via INTRAVENOUS
  Filled 2016-11-10: qty 2

## 2016-11-10 MED ORDER — DIPHENHYDRAMINE HCL 50 MG/ML IJ SOLN
INTRAMUSCULAR | Status: AC
  Filled 2016-11-10: qty 1

## 2016-11-10 MED ORDER — PROMETHAZINE HCL 25 MG/ML IJ SOLN
INTRAMUSCULAR | Status: DC | PRN
  Administered 2016-11-10: 6.25 mg via INTRAVENOUS

## 2016-11-10 MED ORDER — MIDAZOLAM HCL 5 MG/5ML IJ SOLN
INTRAMUSCULAR | Status: DC | PRN
Start: 2016-11-10 — End: 2016-11-10
  Administered 2016-11-10: 2 mg via INTRAVENOUS

## 2016-11-10 MED ORDER — FENTANYL CITRATE (PF) 250 MCG/5ML IJ SOLN
INTRAMUSCULAR | Status: AC
  Filled 2016-11-10: qty 5

## 2016-11-10 MED ORDER — CHLORHEXIDINE GLUCONATE 4 % EX LIQD: 60.0000 mL | Freq: Once | CUTANEOUS | Status: DC

## 2016-11-10 MED ORDER — DEXAMETHASONE SODIUM PHOSPHATE 10 MG/ML IJ SOLN
INTRAMUSCULAR | Status: AC
  Filled 2016-11-10: qty 1

## 2016-11-10 MED ORDER — DOXYCYCLINE MONOHYDRATE 100 MG PO TABS: 100.0000 mg | ORAL_TABLET | Freq: Two times a day (BID) | ORAL | 0 refills | Status: DC

## 2016-11-10 MED ORDER — PROPOFOL 10 MG/ML IV BOLUS
INTRAVENOUS | Status: AC
  Filled 2016-11-10: qty 20

## 2016-11-10 MED ORDER — SUGAMMADEX SODIUM 200 MG/2ML IV SOLN
INTRAVENOUS | Status: DC | PRN
  Administered 2016-11-10: 217.2 mg via INTRAVENOUS

## 2016-11-10 MED ORDER — FENTANYL CITRATE (PF) 100 MCG/2ML IJ SOLN
100.0000 ug | Freq: Once | INTRAMUSCULAR | Status: AC
  Administered 2016-11-10: 100 ug via INTRAVENOUS

## 2016-11-10 MED ORDER — PROMETHAZINE HCL 25 MG/ML IJ SOLN
INTRAMUSCULAR | Status: AC
  Filled 2016-11-10: qty 1

## 2016-11-10 SURGICAL SUPPLY — 49 items
BAG DECANTER FOR FLEXI CONT (MISCELLANEOUS) IMPLANT
BANDAGE ACE 4X5 VEL STRL LF (GAUZE/BANDAGES/DRESSINGS) ×3 IMPLANT
BLADE SURG 10 STRL SS (BLADE) ×6 IMPLANT
BNDG COHESIVE 4X5 TAN STRL (GAUZE/BANDAGES/DRESSINGS) IMPLANT
BNDG GAUZE ELAST 4 BULKY (GAUZE/BANDAGES/DRESSINGS) IMPLANT
COVER SURGICAL LIGHT HANDLE (MISCELLANEOUS) ×3 IMPLANT
CUFF TOURNIQUET SINGLE 34IN LL (TOURNIQUET CUFF) IMPLANT
CUFF TOURNIQUET SINGLE 44IN (TOURNIQUET CUFF) IMPLANT
DRSG ADAPTIC 3X8 NADH LF (GAUZE/BANDAGES/DRESSINGS) IMPLANT
DRSG AQUACEL AG ADV 3.5X 6 (GAUZE/BANDAGES/DRESSINGS) ×3 IMPLANT
DRSG EMULSION OIL 3X3 NADH (GAUZE/BANDAGES/DRESSINGS) IMPLANT
DRSG PAD ABDOMINAL 8X10 ST (GAUZE/BANDAGES/DRESSINGS) IMPLANT
DURAPREP 26ML APPLICATOR (WOUND CARE) ×3 IMPLANT
ELECT NEEDLE TIP 2.8 STRL (NEEDLE) ×3 IMPLANT
ELECT REM PT RETURN 9FT ADLT (ELECTROSURGICAL) ×3
ELECTRODE REM PT RTRN 9FT ADLT (ELECTROSURGICAL) ×1 IMPLANT
GAUZE SPONGE 4X4 12PLY STRL (GAUZE/BANDAGES/DRESSINGS) IMPLANT
GLOVE BIOGEL PI IND STRL 8 (GLOVE) ×1 IMPLANT
GLOVE BIOGEL PI INDICATOR 8 (GLOVE) ×2
GLOVE ECLIPSE 8.0 STRL XLNG CF (GLOVE) ×6 IMPLANT
GOWN STRL REUS W/ TWL LRG LVL3 (GOWN DISPOSABLE) ×2 IMPLANT
GOWN STRL REUS W/TWL LRG LVL3 (GOWN DISPOSABLE) ×4
HANDPIECE INTERPULSE COAX TIP (DISPOSABLE)
KIT BASIN OR (CUSTOM PROCEDURE TRAY) ×3 IMPLANT
KIT ROOM TURNOVER OR (KITS) ×3 IMPLANT
MANIFOLD NEPTUNE II (INSTRUMENTS) ×3 IMPLANT
NS IRRIG 1000ML POUR BTL (IV SOLUTION) ×3 IMPLANT
PACK ORTHO EXTREMITY (CUSTOM PROCEDURE TRAY) IMPLANT
PACK SHOULDER (CUSTOM PROCEDURE TRAY) ×3 IMPLANT
PAD ARMBOARD 7.5X6 YLW CONV (MISCELLANEOUS) ×6 IMPLANT
PENCIL BUTTON HOLSTER BLD 10FT (ELECTRODE) ×3 IMPLANT
SET HNDPC FAN SPRY TIP SCT (DISPOSABLE) IMPLANT
SPONGE LAP 18X18 X RAY DECT (DISPOSABLE) ×6 IMPLANT
SPONGE LAP 4X18 X RAY DECT (DISPOSABLE) ×3 IMPLANT
STAPLER VISISTAT 35W (STAPLE) ×3 IMPLANT
STOCKINETTE IMPERVIOUS 9X36 MD (GAUZE/BANDAGES/DRESSINGS) ×3 IMPLANT
SUT ETHILON 3 0 PS 1 (SUTURE) IMPLANT
SUT MNCRL AB 3-0 PS2 18 (SUTURE) ×3 IMPLANT
SUT MNCRL AB 4-0 PS2 18 (SUTURE) ×3 IMPLANT
SUT VIC AB 2-0 CT1 27 (SUTURE) ×6
SUT VIC AB 2-0 CT1 TAPERPNT 27 (SUTURE) ×3 IMPLANT
SWAB CULTURE ESWAB REG 1ML (MISCELLANEOUS) IMPLANT
TOWEL OR 17X24 6PK STRL BLUE (TOWEL DISPOSABLE) ×3 IMPLANT
TOWEL OR 17X26 10 PK STRL BLUE (TOWEL DISPOSABLE) ×3 IMPLANT
TUBE CONNECTING 12'X1/4 (SUCTIONS) ×1
TUBE CONNECTING 12X1/4 (SUCTIONS) ×2 IMPLANT
UNDERPAD 30X30 (UNDERPADS AND DIAPERS) ×3 IMPLANT
WATER STERILE IRR 1000ML POUR (IV SOLUTION) IMPLANT
YANKAUER SUCT BULB TIP NO VENT (SUCTIONS) ×3 IMPLANT

## 2016-11-10 NOTE — Op Note (Signed)
PATIENT ID:      MARILU RYLANDER  MRN:     [id number] DOB/AGE:    1985-08-08 / 31 y.o.       OPERATIVE REPORT    DATE OF PROCEDURE:  11/10/2016       PREOPERATIVE DIAGNOSIS:   Right Shoulder Mass                                                       Estimated body mass index is 43.09 kg/m as calculated from the following:   Height as of this encounter: 5' 2.5" (1.588 m).   Weight as of this encounter: 239 lb 6.4 oz (108.6 kg).     POSTOPERATIVE DIAGNOSIS:   Right Shoulder Mass                                                                     Estimated body mass index is 43.09 kg/m as calculated from the following:   Height as of this encounter: 5' 2.5" (1.588 m).   Weight as of this encounter: 239 lb 6.4 oz (108.6 kg).     PROCEDURE:  Procedure(s): EXCISION MASS RIGHT SHOULDER     SURGEON:  Joni Fears, MD    ASSISTANT:   Biagio Borg, PA-C   (Present and scrubbed throughout the case, critical for assistance with exposure, retraction, instrumentation, and closure.)          ANESTHESIA: regional and general     DRAINS: none :      TOURNIQUET TIME: * No tourniquets in log *    COMPLICATIONS:  None   CONDITION:  stable  PROCEDURE IN DETAIL: Joy 11/10/2016, 11:36 AM

## 2016-11-10 NOTE — Anesthesia Procedure Notes (Addendum)
Anesthesia Regional Block: Supraclavicular block   Pre-Anesthetic Checklist: ,, timeout performed, Correct Patient, Correct Site, Correct Laterality, Correct Procedure, Correct Position, site marked, Risks and benefits discussed, pre-op evaluation,  At surgeon's request and post-op pain management  Laterality: Right  Prep: chloraprep       Needles:   Needle Type: Echogenic Needle     Needle Length: 9cm  Needle Gauge: 21     Additional Needles:   Procedures:,,,, ultrasound used (permanent image in chart),,,,  Narrative:  Start time: 11/10/2016 9:39 AM End time: 11/10/2016 9:46 AM Injection made incrementally with aspirations every 5 mL. Anesthesiologist: Lyndle Herrlich, MD

## 2016-11-10 NOTE — Anesthesia Preprocedure Evaluation (Signed)
Anesthesia Evaluation  Patient identified by MRN, date of birth, ID band Patient awake    Reviewed: Allergy & Precautions, H&P , Patient's Chart, lab work & pertinent test results, reviewed documented beta blocker date and time   Airway Mallampati: II  TM Distance: >3 FB Neck ROM: full    Dental no notable dental hx.    Pulmonary Current Smoker,    Pulmonary exam normal breath sounds clear to auscultation       Cardiovascular hypertension,  Rhythm:regular Rate:Normal     Neuro/Psych    GI/Hepatic   Endo/Other    Renal/GU      Musculoskeletal   Abdominal   Peds  Hematology   Anesthesia Other Findings   Reproductive/Obstetrics                             Anesthesia Physical Anesthesia Plan  ASA: II  Anesthesia Plan:    Post-op Pain Management:  Regional for Post-op pain   Induction: Intravenous  PONV Risk Score and Plan: 1 and Dexamethasone and Ondansetron  Airway Management Planned: Oral ETT  Additional Equipment:   Intra-op Plan:   Post-operative Plan: Extubation in OR  Informed Consent: I have reviewed the patients History and Physical, chart, labs and discussed the procedure including the risks, benefits and alternatives for the proposed anesthesia with the patient or authorized representative who has indicated his/her understanding and acceptance.   Dental Advisory Given  Plan Discussed with: CRNA and Surgeon  Anesthesia Plan Comments: (  )        Anesthesia Quick Evaluation

## 2016-11-10 NOTE — Anesthesia Procedure Notes (Signed)
Procedure Name: Intubation Date/Time: 11/10/2016 10:39 AM Performed by: Freddie Breech, CRNA Pre-anesthesia Checklist: Patient identified, Emergency Drugs available, Suction available and Patient being monitored Patient Re-evaluated:Patient Re-evaluated prior to induction Oxygen Delivery Method: Circle System Utilized Preoxygenation: Pre-oxygenation with 100% oxygen Induction Type: IV induction Ventilation: Mask ventilation without difficulty Laryngoscope Size: Mac and 3 Grade View: Grade I Tube type: Oral Tube size: 7.0 mm Number of attempts: 1 Airway Equipment and Method: Stylet and Oral airway Placement Confirmation: ETT inserted through vocal cords under direct vision,  positive ETCO2 and breath sounds checked- equal and bilateral Secured at: 21 cm Tube secured with: Tape Dental Injury: Teeth and Oropharynx as per pre-operative assessment

## 2016-11-10 NOTE — Transfer of Care (Signed)
Immediate Anesthesia Transfer of Care Note  Patient: Desiree Howell  Procedure(s) Performed: EXCISION MASS RIGHT SHOULDER (Right Shoulder)  Patient Location: PACU  Anesthesia Type:General and Regional  Level of Consciousness: awake, alert  and patient cooperative  Airway & Oxygen Therapy: Patient Spontanous Breathing and Patient connected to nasal cannula oxygen  Post-op Assessment: Report given to RN, Post -op Vital signs reviewed and stable, Patient moving all extremities X 4 and Patient able to stick tongue midline  Post vital signs: Reviewed and stable  Last Vitals:  Vitals:   11/10/16 0950 11/10/16 1206  BP: 133/83   Pulse: 93   Resp: (!) 23   Temp:  (P) 36.5 C  SpO2: 100%     Last Pain:  Vitals:   11/10/16 0846  TempSrc: Oral         Complications: No apparent anesthesia complications

## 2016-11-10 NOTE — Anesthesia Postprocedure Evaluation (Signed)
Anesthesia Post Note  Patient: Desiree Howell  Procedure(s) Performed: EXCISION MASS RIGHT SHOULDER (Right Shoulder)     Patient location during evaluation: PACU Anesthesia Type: General Level of consciousness: awake and alert Pain management: pain level controlled Vital Signs Assessment: post-procedure vital signs reviewed and stable Respiratory status: spontaneous breathing, nonlabored ventilation, respiratory function stable and patient connected to nasal cannula oxygen Cardiovascular status: blood pressure returned to baseline and stable Postop Assessment: no apparent nausea or vomiting Anesthetic complications: no    Last Vitals:  Vitals:   11/10/16 1249 11/10/16 1250  BP: 126/84   Pulse: 64   Resp: (!) 21   Temp:  36.7 C  SpO2: 100%     Last Pain:  Vitals:   11/10/16 1250  TempSrc:   PainSc: 0-No pain                 Lathyn Griggs EDWARD

## 2016-11-11 ENCOUNTER — Encounter (HOSPITAL_COMMUNITY): Payer: Self-pay | Admitting: Orthopaedic Surgery

## 2016-11-11 ENCOUNTER — Encounter (INDEPENDENT_AMBULATORY_CARE_PROVIDER_SITE_OTHER): Payer: Self-pay | Admitting: Radiology

## 2016-11-11 NOTE — Op Note (Deleted)
  The note originally documented on this encounter has been moved the the encounter in which it belongs.  

## 2016-11-11 NOTE — Op Note (Signed)
NAMERAMYAH, PANKOWSKI NO.:  [id number]  MEDICAL RECORD NO.:  [id number]  LOCATION:                               FACILITY:  [facility]  PHYSICIAN:  Vonna Kotyk. Adolphe Fortunato, M.D.DATE OF BIRTH:  1985/09/02  DATE OF PROCEDURE:  11/10/2016 DATE OF DISCHARGE:  11/10/2016                              OPERATIVE REPORT   PREOPERATIVE DIAGNOSIS:  Lipomatous mass, anterior aspect, right shoulder.  POSTOPERATIVE DIAGNOSIS:  Lipomatous mass, anterior aspect, right shoulder - cytology pending.  PROCEDURE:  Excision of large lipomatous mass, right shoulder.  SURGEON:  Vonna Kotyk. Durward Fortes, MD.  ASSISTANT:  Biagio Borg, PAC.  ANESTHESIA:  General with supplemental interscalene nerve block.  COMPLICATIONS:  None.  HISTORY:  A 31 year old female has noted a large mass on the anterior aspect of her right shoulder for well over a year.  I have seen her in the office and obtained an MRI scan that demonstrates a large 8-9 cm in diameter mass that appears to be a lipoma.  She is now to have surgical excision.  DESCRIPTION OF PROCEDURE:  Ms. Maniscalco was met in the holding area with her family, identified the right shoulder as appropriate operative site and marked it accordingly.  Anesthesia performed an interscalene nerve block.  The patient was then transported to room #7 and placed under general anesthesia without difficulty.  She was then carefully placed in a semi-sitting position with a shoulder frame.  The arm was draped free after chlorhexidine prep and DuraPrep.  Prep was from the base of the neck circumferentially to the wrist.  Sterile draping was performed.  A time-out was called.  Incision about 4 inches was made opening along the posterior aspect of the acromion to the anterior acromion near the acromioclavicular joint. Via sharp dissection, incision was carried down to subcutaneous tissue. Any gross bleeders were Bovie coagulated.  A large fatty tumor was  then encountered and I was able to elliptically excise it with finger palpation posteriorly, anteriorly, medially and laterally and then dissected free from any overlying tissue, appeared to be relatively well encapsulated and was removed in one large mass.  It was to be sent to Pathology for cell identification.  The large cavity was then irrigated.  Gross bleeders were Bovie coagulated.  The wound was then closed in careful layers to help prevent any third spacing or dead space.  We used 2-0 Vicryl sutures and 3-0 Monocryl for the subcuticular area.  I did excise some extraneous skin. I thought I had a very nice closure and we attempted to make it as anatomic as possible.  Sterile bulky dressing was applied followed by a sling.  The patient tolerated the procedure without complications.  She returned to the postanesthesia recovery room without difficulty.  We plan to give her Norco for pain, sling, and office the first of the week.     Vonna Kotyk. Durward Fortes, M.D.     PWW/MEDQ  D:  11/10/2016  T:  11/11/2016  Job:  [id number]

## 2016-11-16 ENCOUNTER — Encounter (INDEPENDENT_AMBULATORY_CARE_PROVIDER_SITE_OTHER): Payer: Self-pay | Admitting: Orthopaedic Surgery

## 2016-11-16 ENCOUNTER — Ambulatory Visit (INDEPENDENT_AMBULATORY_CARE_PROVIDER_SITE_OTHER): Payer: Medicaid Other | Admitting: Orthopaedic Surgery

## 2016-11-16 VITALS — Resp 14 | Ht 63.0 in | Wt 225.0 lb

## 2016-11-16 DIAGNOSIS — G8929 Other chronic pain: Secondary | ICD-10-CM

## 2016-11-16 DIAGNOSIS — M25511 Pain in right shoulder: Secondary | ICD-10-CM

## 2016-11-16 NOTE — Progress Notes (Signed)
   Office Visit Note   Patient: Desiree Howell           Date of Birth: 08-25-1985           MRN: [id number] Visit Date: 11/16/2016              Requested by: No referring provider defined for this encounter. PCP: Patient, No Pcp Per   Assessment & Plan: Visit Diagnoses:  1. Chronic right shoulder pain     Plan: One week status post excision of a very large right palmar from the right shoulder. Doing well. Pathology report was consistent with lipoma  Follow-Up Instructions: Return in about 2 weeks (around 11/30/2016).   Orders:  No orders of the defined types were placed in this encounter.  No orders of the defined types were placed in this encounter.     Procedures: No procedures performed   Clinical Data: No additional findings.   Subjective: Chief Complaint  Patient presents with  . Right Arm - Routine Post Op    Desiree Howell is 6 days S/P Right arm lipoma removed.  HPI  Review of Systems  Constitutional: Negative for chills, fatigue and fever.  Eyes: Negative for itching.  Respiratory: Negative for chest tightness and shortness of breath.   Cardiovascular: Negative for chest pain, palpitations and leg swelling.  Gastrointestinal: Negative for blood in stool, constipation and diarrhea.  Endocrine: Negative for polyuria.  Genitourinary: Negative for dysuria.  Musculoskeletal: Negative for back pain, joint swelling, neck pain and neck stiffness.  Allergic/Immunologic: Negative for immunocompromised state.  Neurological: Negative for dizziness and numbness.  Hematological: Does not bruise/bleed easily.  Psychiatric/Behavioral: The patient is not nervous/anxious.      Objective: Vital Signs: Resp 14   Ht 5\' 3"  (1.6 m)   Wt 225 lb (102.1 kg)   BMI 39.86 kg/m   Physical Exam  Ortho ExamAwake alert and oriented 3. Comfortable sitting. Dressing right shoulder was removed. Wound looks just fine. I removed the clips applied Steri-Strips over benzoin. Good  sensibility. Able to raise her arm over her head. Grip and release. Biopsy report was consistent with a benign lipoma. We'll plan to see her back in the office in 2 weeks  Specialty Comments:  No specialty comments available.  Imaging: No results found.   PMFS History: Patient Active Problem List   Diagnosis Date Noted  . Mass of shoulder region 11/10/2016  . 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 Bipolar 1 disorder ([place])   . Depression   . GERD (gastroesophageal reflux disease)    Zantac  otc  . Hypertension    no medication    History reviewed. No pertinent family history.  Past Surgical History:  Procedure Laterality Date  . CESAREAN SECTION     Social History   Occupational History  . Not on file  Tobacco Use  . Smoking status: Light Tobacco Smoker    Types: Cigarettes  . Smokeless tobacco: Never Used  . Tobacco comment: 10 cigeretts a month  Substance and Sexual Activity  . Alcohol use: Yes    Comment: occasionally  . Drug use: No  . Sexual activity: Not on file

## 2016-11-30 ENCOUNTER — Ambulatory Visit (INDEPENDENT_AMBULATORY_CARE_PROVIDER_SITE_OTHER): Payer: Medicaid Other | Admitting: Orthopaedic Surgery

## 2016-12-06 ENCOUNTER — Emergency Department (HOSPITAL_COMMUNITY)
Admission: EM | Admit: 2016-12-06 | Discharge: 2016-12-06 | Disposition: A | Discharge status: Home / Self Care | Payer: Medicaid Other | Attending: Emergency Medicine | Admitting: Emergency Medicine

## 2016-12-06 ENCOUNTER — Encounter (HOSPITAL_COMMUNITY): Payer: Self-pay

## 2016-12-06 DIAGNOSIS — I10 Essential (primary) hypertension: Secondary | ICD-10-CM | POA: Insufficient documentation

## 2016-12-06 DIAGNOSIS — B9689 Other specified bacterial agents as the cause of diseases classified elsewhere: Secondary | ICD-10-CM | POA: Diagnosis not present

## 2016-12-06 DIAGNOSIS — F17210 Nicotine dependence, cigarettes, uncomplicated: Secondary | ICD-10-CM | POA: Diagnosis not present

## 2016-12-06 DIAGNOSIS — R102 Pelvic and perineal pain: Secondary | ICD-10-CM | POA: Diagnosis present

## 2016-12-06 DIAGNOSIS — N760 Acute vaginitis: Secondary | ICD-10-CM | POA: Insufficient documentation

## 2016-12-06 LAB — COMPREHENSIVE METABOLIC PANEL
ALBUMIN: 3.5 g/dL (ref 3.5–5.0)
ALT: 10 U/L — AB (ref 14–54)
AST: 17 U/L (ref 15–41)
Alkaline Phosphatase: 64 U/L (ref 38–126)
Anion gap: 9 (ref 5–15)
BILIRUBIN TOTAL: 0.4 mg/dL (ref 0.3–1.2)
BUN: 10 mg/dL (ref 6–20)
CHLORIDE: 108 mmol/L (ref 101–111)
CO2: 20 mmol/L — ABNORMAL LOW (ref 22–32)
CREATININE: 0.92 mg/dL (ref 0.44–1.00)
Calcium: 8.7 mg/dL — ABNORMAL LOW (ref 8.9–10.3)
GFR calc Af Amer: >60 mL/min (ref >60)
GLUCOSE: 109 mg/dL — AB (ref 65–99)
Potassium: 3.8 mmol/L (ref 3.5–5.1)
Sodium: 137 mmol/L (ref 135–145)
TOTAL PROTEIN: 6.5 g/dL (ref 6.5–8.1)

## 2016-12-06 LAB — CBC
HCT: 39.2 % (ref 36.0–46.0)
Hemoglobin: 13 g/dL (ref 12.0–15.0)
MCH: 32.7 pg (ref 26.0–34.0)
MCHC: 33.2 g/dL (ref 30.0–36.0)
MCV: 98.5 fL (ref 78.0–100.0)
Platelets: 428 10*3/uL — ABNORMAL HIGH (ref 150–400)
RBC: 3.98 MIL/uL (ref 3.87–5.11)
RDW: 12.9 % (ref 11.5–15.5)
WBC: 7.4 10*3/uL (ref 4.0–10.5)

## 2016-12-06 LAB — WET PREP, GENITAL
Sperm: NONE SEEN
Trich, Wet Prep: NONE SEEN
Yeast Wet Prep HPF POC: NONE SEEN

## 2016-12-06 LAB — I-STAT BETA HCG BLOOD, ED (MC, WL, AP ONLY): I-stat hCG, quantitative: <5 m[IU]/mL (ref <5)

## 2016-12-06 LAB — LIPASE, BLOOD: Lipase: 30 U/L (ref 11–51)

## 2016-12-06 MED ORDER — IBUPROFEN 600 MG PO TABS: 600.0000 mg | ORAL_TABLET | Freq: Four times a day (QID) | ORAL | 0 refills | Status: DC | PRN

## 2016-12-06 MED ORDER — METRONIDAZOLE 500 MG PO TABS: 500.0000 mg | ORAL_TABLET | Freq: Two times a day (BID) | ORAL | 0 refills | Status: DC

## 2016-12-06 NOTE — ED Triage Notes (Signed)
Patient complains of RLQ pain with mild vaginal discharge x 1 week. Reports vomiting with same, alert and oriented, NAD

## 2016-12-06 NOTE — ED Notes (Signed)
Pt discharged from ED; instructions provided and scripts given; Pt encouraged to return to ED if symptoms worsen and to f/u with PCP; Pt verbalized understanding of all instructions 

## 2016-12-06 NOTE — ED Provider Notes (Signed)
[facility] EMERGENCY DEPARTMENT Provider Note   CSN: [id number] Arrival date & time: 12/06/16  1457     History   Chief Complaint No chief complaint on file.   HPI Desiree Howell is a 31 y.o. female.  HPI Patient reports she has been having some lower abdominal discomfort for about 7 days.  Reports some vaginal discharge.  No abnormal bleeding.  Patient reports she is sexually active.  She reports she does have some concern for STD but has not been told that her partner has anything.  No fevers no chills.  Occasional vomiting.  No pain burning or urgency with urination. Past Medical History:  Diagnosis Date  . Bipolar 1 disorder ([place])   . Depression   . GERD (gastroesophageal reflux disease)    Zantac  otc  . Hypertension    no medication    Patient Active Problem List   Diagnosis Date Noted  . Mass of shoulder region 11/10/2016  . 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 CESAREAN SECTION    . EXCISION MASS UPPER EXTREMETIES Right 11/10/2016   Procedure: EXCISION MASS RIGHT SHOULDER;  Surgeon: Garald Balding, MD;  Location: [facility];  Service: Orthopedics;  Laterality: Right;    OB History    No data available       Home Medications    Prior to Admission medications   Medication Sig Start Date End Date Taking? Authorizing Provider  acetaminophen (TYLENOL) 500 MG tablet Take 1,000 mg by mouth every 6 (six) hours as needed for headache (pain).   Yes [provider]  doxycycline (ADOXA) 100 MG tablet Take 1 tablet (100 mg total) 2 (two) times daily by mouth. Patient not taking: Reported on 11/16/2016 11/10/16   Cherylann Ratel, PA-C  HYDROcodone-acetaminophen (NORCO) 5-325 MG tablet Take 1 tablet every 4 (four) hours as needed by mouth for moderate pain or severe pain. Patient not taking: Reported on 11/16/2016 11/10/16   Cherylann Ratel, PA-C  ibuprofen (ADVIL,MOTRIN) 600 MG  tablet Take 1 tablet (600 mg total) by mouth every 6 (six) hours as needed. 12/06/16   Charlesetta Shanks, MD  metroNIDAZOLE (FLAGYL) 500 MG tablet Take 1 tablet (500 mg total) by mouth 2 (two) times daily. One po bid x 7 days 12/06/16   Charlesetta Shanks, MD    Family History No family history on file.  Social History Social History   Tobacco Use  . Smoking status: Light Tobacco Smoker    Types: Cigarettes  . Smokeless tobacco: Never Used  . Tobacco comment: 10 cigeretts a month  Substance Use Topics  . Alcohol use: Yes    Comment: occasionally  . Drug use: No     Allergies   Patient has no known allergies.   Review of Systems Review of Systems 10 Systems reviewed and are negative for acute change except as noted in the HPI.   Physical Exam Updated Vital Signs BP 121/81   Pulse 69   Temp 98.2 F (36.8 C) (Oral)   Resp 16   Ht 5\' 2"  (1.575 m)   Wt 102.1 kg (225 lb)   SpO2 100%   BMI 41.15 kg/m   Physical Exam  Constitutional: She is oriented to person, place, and time. She appears well-developed and well-nourished. No distress.  HENT:  Head: Normocephalic and atraumatic.  Eyes: Conjunctivae are normal.  Neck: Neck supple.  Cardiovascular: Normal rate and regular rhythm.  No murmur heard. Pulmonary/Chest: Effort normal and breath sounds normal. No respiratory distress.  Abdominal: Soft. She exhibits no distension. There is no tenderness. There is no guarding.  Genitourinary:  Genitourinary Comments: Normal external female genitalia.  Speculum examination very mild amount of white discharge in the vault.  Rex is not friable.  Bimanual nontender.  Musculoskeletal: She exhibits no edema or tenderness.  Neurological: She is alert and oriented to person, place, and time. No cranial nerve deficit. She exhibits normal muscle tone. Coordination normal.  Skin: Skin is warm and dry.  Psychiatric: She has a normal mood and affect.  Nursing note and vitals reviewed.    ED  Treatments / Results  Labs (all labs ordered are listed, but only abnormal results are displayed) Labs Reviewed  WET PREP, GENITAL - Abnormal; Notable for the following components:      Result Value   Clue Cells Wet Prep HPF POC PRESENT (*)    WBC, Wet Prep HPF POC MANY (*)    All other components within normal limits  COMPREHENSIVE METABOLIC PANEL - Abnormal; Notable for the following components:   CO2 20 (*)    Glucose, Bld 109 (*)    Calcium 8.7 (*)    ALT 10 (*)    All other components within normal limits  CBC - Abnormal; Notable for the following components:   Platelets 428 (*)    All other components within normal limits  LIPASE, BLOOD  URINALYSIS, ROUTINE W REFLEX MICROSCOPIC  RPR  HIV ANTIBODY (ROUTINE TESTING)  I-STAT BETA HCG BLOOD, ED ([facility], [facility], [facility] ONLY)  GC/CHLAMYDIA PROBE AMP ([place]) NOT AT [facility]    EKG  EKG Interpretation None       Radiology No results found.  Procedures Procedures (including critical care time)  Medications Ordered in ED Medications - No data to display   Initial Impression / Assessment and Plan / ED Course  I have reviewed the triage vital signs and the nursing notes.  Pertinent labs & imaging results that were available during my care of the patient were reviewed by me and considered in my medical decision making (see chart for details).      Final Clinical Impressions(s) / ED Diagnoses   Final diagnoses:  Pelvic pain in female  Bacterial vaginosis   Patient presents with some pelvic discomfort and some concern for STD.  Exam is nontender with no abnormal discharge or friability.  Wet prep does show clue cells will treat for BV.  Patient will await culture results for further antibiotic treatment.  She is otherwise well in appearance with no distress and no acute pain. ED Discharge Orders        Ordered    metroNIDAZOLE (FLAGYL) 500 MG tablet  2 times daily     12/06/16 2204    ibuprofen (ADVIL,MOTRIN) 600 MG tablet   Every 6 hours PRN     12/06/16 2204       Charlesetta Shanks, MD 12/06/16 2210

## 2016-12-06 NOTE — ED Notes (Signed)
Pt reports being "sick" for the last week; states that stomach hurts so bad; in lower abd; and states that she had diarrhea three days, from Sun-Thurs this past week; states that she has been nauseous.

## 2016-12-06 NOTE — ED Notes (Signed)
Pt refused venipuncture,  Nurse notified. 

## 2016-12-07 LAB — GC/CHLAMYDIA PROBE AMP (~~LOC~~) NOT AT ARMC
Chlamydia: NEGATIVE
Neisseria Gonorrhea: NEGATIVE

## 2016-12-07 LAB — RPR: RPR: NONREACTIVE

## 2016-12-07 LAB — HIV ANTIBODY (ROUTINE TESTING W REFLEX): HIV Screen 4th Generation wRfx: NONREACTIVE

## 2016-12-14 ENCOUNTER — Ambulatory Visit (INDEPENDENT_AMBULATORY_CARE_PROVIDER_SITE_OTHER): Payer: Medicaid Other | Admitting: Orthopaedic Surgery

## 2016-12-15 ENCOUNTER — Ambulatory Visit (INDEPENDENT_AMBULATORY_CARE_PROVIDER_SITE_OTHER): Payer: Medicaid Other | Admitting: Orthopaedic Surgery

## 2017-01-06 ENCOUNTER — Other Ambulatory Visit: Payer: Self-pay

## 2017-01-06 ENCOUNTER — Emergency Department (HOSPITAL_COMMUNITY)
Admission: EM | Admit: 2017-01-06 | Discharge: 2017-01-06 | Disposition: A | Discharge status: Home / Self Care | Payer: Medicaid Other | Attending: Emergency Medicine | Admitting: Emergency Medicine

## 2017-01-06 ENCOUNTER — Encounter (HOSPITAL_COMMUNITY): Payer: Self-pay

## 2017-01-06 DIAGNOSIS — F17210 Nicotine dependence, cigarettes, uncomplicated: Secondary | ICD-10-CM | POA: Diagnosis not present

## 2017-01-06 DIAGNOSIS — B349 Viral infection, unspecified: Secondary | ICD-10-CM | POA: Diagnosis not present

## 2017-01-06 DIAGNOSIS — I10 Essential (primary) hypertension: Secondary | ICD-10-CM | POA: Insufficient documentation

## 2017-01-06 DIAGNOSIS — R112 Nausea with vomiting, unspecified: Secondary | ICD-10-CM | POA: Diagnosis not present

## 2017-01-06 DIAGNOSIS — Z720 Tobacco use: Secondary | ICD-10-CM

## 2017-01-06 DIAGNOSIS — R5383 Other fatigue: Secondary | ICD-10-CM | POA: Insufficient documentation

## 2017-01-06 DIAGNOSIS — R05 Cough: Secondary | ICD-10-CM | POA: Diagnosis not present

## 2017-01-06 DIAGNOSIS — M7918 Myalgia, other site: Secondary | ICD-10-CM | POA: Insufficient documentation

## 2017-01-06 DIAGNOSIS — R0981 Nasal congestion: Secondary | ICD-10-CM | POA: Diagnosis present

## 2017-01-06 MED ORDER — ONDANSETRON 4 MG PO TBDP
8.0000 mg | ORAL_TABLET | Freq: Once | ORAL | Status: AC
Start: 2017-01-06 — End: 2017-01-06
  Administered 2017-01-06: 8 mg via ORAL
  Filled 2017-01-06: qty 2

## 2017-01-06 MED ORDER — IBUPROFEN 800 MG PO TABS
800.0000 mg | ORAL_TABLET | Freq: Once | ORAL | Status: AC
  Administered 2017-01-06: 800 mg via ORAL
  Filled 2017-01-06: qty 1

## 2017-01-06 NOTE — ED Provider Notes (Signed)
[facility] EMERGENCY DEPARTMENT Provider Note   CSN: [id number] Arrival date & time: 01/06/17  3235     History   Chief Complaint Chief Complaint  Patient presents with  . Generalized Body Aches    HPI Desiree Howell is a 32 y.o. female.  HPI  32 year old female history of hypertension, bipolar disorder, smoker presents today with one-week history of nasal congestion, cough, and body aches.  She denies fever but states she has had some chills.  She has been taking over-the-counter medication including Tylenol without relief.  She has continued to smoke.  She denies having a flu immunization.  She denies sick contacts.  Last menstrual period was normal and she has had a bilateral tubal ligation.  Past Medical History:  Diagnosis Date  . Bipolar 1 disorder ([place])   . Depression   . GERD (gastroesophageal reflux disease)    Zantac  otc  . Hypertension    no medication    Patient Active Problem List   Diagnosis Date Noted  . Mass of shoulder region 11/10/2016  . 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 CESAREAN SECTION    . EXCISION MASS UPPER EXTREMETIES Right 11/10/2016   Procedure: EXCISION MASS RIGHT SHOULDER;  Surgeon: Garald Balding, MD;  Location: [facility];  Service: Orthopedics;  Laterality: Right;    OB History    No data available       Home Medications    Prior to Admission medications   Medication Sig Start Date End Date Taking? Authorizing Provider  acetaminophen (TYLENOL) 500 MG tablet Take 1,000 mg by mouth every 6 (six) hours as needed for headache (pain).    [provider]  doxycycline (ADOXA) 100 MG tablet Take 1 tablet (100 mg total) 2 (two) times daily by mouth. Patient not taking: Reported on 11/16/2016 11/10/16   Cherylann Ratel, PA-C  HYDROcodone-acetaminophen (NORCO) 5-325 MG tablet Take 1 tablet every 4 (four) hours as needed by mouth for moderate pain or  severe pain. Patient not taking: Reported on 11/16/2016 11/10/16   Cherylann Ratel, PA-C  ibuprofen (ADVIL,MOTRIN) 600 MG tablet Take 1 tablet (600 mg total) by mouth every 6 (six) hours as needed. 12/06/16   Charlesetta Shanks, MD  metroNIDAZOLE (FLAGYL) 500 MG tablet Take 1 tablet (500 mg total) by mouth 2 (two) times daily. One po bid x 7 days 12/06/16   Charlesetta Shanks, MD    Family History No family history on file.  Social History Social History   Tobacco Use  . Smoking status: Light Tobacco Smoker    Types: Cigarettes  . Smokeless tobacco: Never Used  . Tobacco comment: 10 cigeretts a month  Substance Use Topics  . Alcohol use: Yes    Comment: occasionally  . Drug use: No     Allergies   Patient has no known allergies.   Review of Systems Review of Systems  Constitutional: Positive for fatigue.  HENT: Positive for congestion, rhinorrhea, sneezing and sore throat. Negative for trouble swallowing.   Eyes: Negative.   Respiratory: Positive for cough. Negative for shortness of breath and wheezing.   Cardiovascular: Negative.   Gastrointestinal: Positive for nausea and vomiting. Negative for diarrhea.       She has vomited 2-3 times over the past week and is tolerating liquids without difficulty currently  Endocrine: Negative.   Genitourinary: Negative.   Musculoskeletal: Positive for myalgias.  Skin: Negative.  Allergic/Immunologic: Negative.   Neurological: Negative.   Hematological: Negative.   Psychiatric/Behavioral: Negative.   All other systems reviewed and are negative.    Physical Exam Updated Vital Signs BP 104/79 (BP Location: Right Arm)   Pulse 84   Temp 98.2 F (36.8 C) (Oral)   Resp 18   LMP 12/22/2016   SpO2 98%   Physical Exam  Constitutional: She is oriented to person, place, and time. She appears well-developed and well-nourished.  HENT:  Head: Normocephalic and atraumatic.  Right Ear: External ear normal.  Left Ear: External ear normal.   Nose: Nose normal.  Mouth/Throat: Oropharynx is clear and moist.  TMs clear bilaterally  Eyes: Conjunctivae and EOM are normal. Pupils are equal, round, and reactive to light.  Neck: Neck supple.  Cardiovascular: Normal rate, regular rhythm and normal heart sounds.  Pulmonary/Chest: Effort normal and breath sounds normal.  Abdominal: Soft. Bowel sounds are normal.  Musculoskeletal: Normal range of motion.  Neurological: She is alert and oriented to person, place, and time.  Skin: Skin is warm and dry. Capillary refill takes less than 2 seconds.  Psychiatric: She has a normal mood and affect.  Nursing note and vitals reviewed.    ED Treatments / Results  Labs (all labs ordered are listed, but only abnormal results are displayed) Labs Reviewed - No data to display  EKG  EKG Interpretation None       Radiology No results found.  Procedures Procedures (including critical care time)  Medications Ordered in ED Medications - No data to display   Initial Impression / Assessment and Plan / ED Course  I have reviewed the triage vital signs and the nursing notes.  Pertinent labs & imaging results that were available during my care of the patient were reviewed by me and considered in my medical decision making (see chart for details).     Discussed symptoms of viral versus bacterial infection.  We have discussed smoking sensation.  We discussed return precautions and need for close follow-up and she voices understanding. Final Clinical Impressions(s) / ED Diagnoses   Final diagnoses:  Viral syndrome  Tobacco abuse    ED Discharge Orders    None       Pattricia Boss, MD 01/06/17 1001

## 2017-01-06 NOTE — ED Notes (Signed)
Pt ambulated to room from waiting room, tolerated well. 

## 2017-01-06 NOTE — ED Triage Notes (Signed)
Pt reports she has experienced flu like symptoms all week including cough, runny nose, nausea, vomiting, but last night she began having generalized body aches. No relief with 1 dose of tylenol.

## 2017-03-03 ENCOUNTER — Encounter (HOSPITAL_COMMUNITY): Payer: Self-pay | Admitting: Emergency Medicine

## 2017-03-03 ENCOUNTER — Other Ambulatory Visit: Payer: Self-pay

## 2017-03-03 DIAGNOSIS — L02412 Cutaneous abscess of left axilla: Secondary | ICD-10-CM | POA: Diagnosis present

## 2017-03-03 DIAGNOSIS — I10 Essential (primary) hypertension: Secondary | ICD-10-CM | POA: Diagnosis not present

## 2017-03-03 DIAGNOSIS — F17210 Nicotine dependence, cigarettes, uncomplicated: Secondary | ICD-10-CM | POA: Diagnosis not present

## 2017-03-03 DIAGNOSIS — L732 Hidradenitis suppurativa: Secondary | ICD-10-CM | POA: Diagnosis not present

## 2017-03-03 DIAGNOSIS — Z79899 Other long term (current) drug therapy: Secondary | ICD-10-CM | POA: Insufficient documentation

## 2017-03-03 MED ORDER — ACETAMINOPHEN 325 MG PO TABS
650.0000 mg | ORAL_TABLET | Freq: Once | ORAL | Status: AC
  Administered 2017-03-03: 650 mg via ORAL
  Filled 2017-03-03: qty 2

## 2017-03-03 NOTE — ED Triage Notes (Signed)
Patient with boil up under left arm.  She states she started noticing it yesterday and the pain has gotten worse.

## 2017-03-04 ENCOUNTER — Emergency Department (HOSPITAL_COMMUNITY)
Admission: EM | Admit: 2017-03-04 | Discharge: 2017-03-04 | Disposition: A | Discharge status: Home / Self Care | Payer: Medicaid Other | Attending: Emergency Medicine | Admitting: Emergency Medicine

## 2017-03-04 DIAGNOSIS — L732 Hidradenitis suppurativa: Secondary | ICD-10-CM

## 2017-03-04 MED ORDER — DOXYCYCLINE HYCLATE 100 MG PO TABS
100.0000 mg | ORAL_TABLET | Freq: Once | ORAL | Status: AC
  Administered 2017-03-04: 100 mg via ORAL
  Filled 2017-03-04: qty 1

## 2017-03-04 MED ORDER — OXYCODONE-ACETAMINOPHEN 5-325 MG PO TABS
1.0000 | ORAL_TABLET | Freq: Once | ORAL | Status: AC
  Administered 2017-03-04: 1 via ORAL
  Filled 2017-03-04: qty 1

## 2017-03-04 MED ORDER — IBUPROFEN 800 MG PO TABS: 800.0000 mg | ORAL_TABLET | Freq: Three times a day (TID) | ORAL | 0 refills | Status: AC

## 2017-03-04 MED ORDER — DOXYCYCLINE HYCLATE 100 MG PO CAPS: 100.0000 mg | ORAL_CAPSULE | Freq: Two times a day (BID) | ORAL | 0 refills | Status: DC

## 2017-03-04 NOTE — Discharge Instructions (Signed)
Take the prescribed medication as directed. Follow-up with the surgical clinic to see about getting this problem addressed-- call in the morning to set up appt. Return to the ED for new or worsening symptoms.

## 2017-03-04 NOTE — ED Provider Notes (Signed)
[facility] EMERGENCY DEPARTMENT Provider Note   CSN: [id number] Arrival date & time: 03/03/17  2302     History   Chief Complaint Chief Complaint  Patient presents with  . Abscess    HPI Desiree Howell is a 32 y.o. female.  The history is provided by the patient and medical records.  Abscess     32 y.o. F with hx of bipolar disorder, depression, GERD, HTN, presenting to the ED for abscess of left axilla.  Patient states she has multiple boils under her left arm which have been present for "a while".  States axillary area got increasingly more painful today with some increased redness of the skin.  She denies fever, chills.  States usually she can get the area to "come to a head" and drain with warm compresses and putting fat-back on the skin.  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  No hx of MRSA, HIV, or DM.  Past Medical History:  Diagnosis Date  . Bipolar 1 disorder ([place])   . Depression   . GERD (gastroesophageal reflux disease)    Zantac  otc  . Hypertension    no medication    Patient Active Problem List   Diagnosis Date Noted  . Mass of shoulder region 11/10/2016  . 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 CESAREAN SECTION    . EXCISION MASS UPPER EXTREMETIES Right 11/10/2016   Procedure: EXCISION MASS RIGHT SHOULDER;  Surgeon: Garald Balding, MD;  Location: [facility];  Service: Orthopedics;  Laterality: Right;    OB History    No data available       Home Medications    Prior to Admission medications   Medication Sig Start Date End Date Taking? Authorizing Provider  acetaminophen (TYLENOL) 500 MG tablet Take 1,000 mg by mouth every 6 (six) hours as needed for headache (pain).    [provider]  doxycycline (ADOXA) 100 MG tablet Take 1 tablet (100 mg total) 2 (two) times daily by mouth. Patient not taking: Reported on 11/16/2016 11/10/16    Cherylann Ratel, PA-C  doxycycline (VIBRAMYCIN) 100 MG capsule Take 1 capsule (100 mg total) by mouth 2 (two) times daily. 03/04/17   Larene Pickett, PA-C  HYDROcodone-acetaminophen (NORCO) 5-325 MG tablet Take 1 tablet every 4 (four) hours as needed by mouth for moderate pain or severe pain. Patient not taking: Reported on 11/16/2016 11/10/16   Cherylann Ratel, PA-C  ibuprofen (ADVIL,MOTRIN) 800 MG tablet Take 1 tablet (800 mg total) by mouth 3 (three) times daily. 03/04/17   Larene Pickett, PA-C  metroNIDAZOLE (FLAGYL) 500 MG tablet Take 1 tablet (500 mg total) by mouth 2 (two) times daily. One po bid x 7 days 12/06/16   Charlesetta Shanks, MD    Family History No family history on file.  Social History Social History   Tobacco Use  . Smoking status: Light Tobacco Smoker    Types: Cigarettes  . Smokeless tobacco: Never Used  . Tobacco comment: 10 cigeretts a month  Substance Use Topics  . Alcohol use: Yes    Comment: occasionally  . Drug use: No     Allergies   Patient has no known allergies.   Review of Systems Review of Systems  Skin: Positive for wound.  All other systems reviewed and are negative.    Physical Exam Updated Vital Signs BP (!) 124/91 (BP  Location: Right Arm)   Pulse 83   Temp 98 F (36.7 C) (Oral)   Resp 18   LMP 03/02/2017 (Exact Date)   SpO2 100%   Physical Exam  Constitutional: She is oriented to person, place, and time. She appears well-developed and well-nourished.  HENT:  Head: Normocephalic and atraumatic.  Mouth/Throat: Oropharynx is clear and moist.  Eyes: Conjunctivae and EOM are normal. Pupils are equal, round, and reactive to light.  Neck: Normal range of motion.  Cardiovascular: Normal rate, regular rhythm and normal heart sounds.  Pulmonary/Chest: Effort normal and breath sounds normal.  Abdominal: Soft. Bowel sounds are normal.  Musculoskeletal: Normal range of motion.  Multiple scars and scar tissue of left axilla; multiple  boils, 2 of which are open and draining, several others are firm and tender to the touch without fluctuance; no streaking of the arm or chest wall  Neurological: She is alert and oriented to person, place, and time.  Skin: Skin is warm and dry.  Psychiatric: She has a normal mood and affect.  Nursing note and vitals reviewed.    ED Treatments / Results  Labs (all labs ordered are listed, but only abnormal results are displayed) Labs Reviewed - No data to display  EKG  EKG Interpretation None       Radiology No results found.  Procedures Procedures (including critical care time)  Medications Ordered in ED Medications  acetaminophen (TYLENOL) tablet 650 mg (650 mg Oral Given 03/03/17 2326)  doxycycline (VIBRA-TABS) tablet 100 mg (100 mg Oral Given 03/04/17 0140)  oxyCODONE-acetaminophen (PERCOCET/ROXICET) 5-325 MG per tablet 1 tablet (1 tablet Oral Given 03/04/17 0140)     Initial Impression / Assessment and Plan / ED Course  I have reviewed the triage vital signs and the nursing notes.  Pertinent labs & imaging results that were available during my care of the patient were reviewed by me and considered in my medical decision making (see chart for details).  32 year old female presenting to the ED with abscess of left axilla.  On exam she has multiple areas of scarring as well as scar tissue under the axilla and small boils that are hard and tender to palpation.  There are 2 open areas that are actively draining but in her particular area of pain, there is no fluctuance or drainable fluid collection at this time.  Both of patient's axillas with similar appearance, raising suspicion for hidradenitis.  Patient states she suspected that is what she has but has never seen a specialist about this.  She will be referred to general surgery clinic, started on doxycycline.  Continue warm compresses at home.  She understands to return here for any new/worsening symptoms.  Final Clinical  Impressions(s) / ED Diagnoses   Final diagnoses:  Hidradenitis    ED Discharge Orders        Ordered    doxycycline (VIBRAMYCIN) 100 MG capsule  2 times daily     03/04/17 0121    ibuprofen (ADVIL,MOTRIN) 800 MG tablet  3 times daily     03/04/17 0121       Larene Pickett, PA-C 03/04/17 0253    Ripley Fraise, MD 03/04/17 [phone]

## 2017-03-04 NOTE — ED Notes (Signed)
Pt departed in NAD, refused use of wheelchair.  

## 2017-06-12 ENCOUNTER — Emergency Department (HOSPITAL_COMMUNITY): Payer: Medicaid Other

## 2017-06-12 ENCOUNTER — Emergency Department (HOSPITAL_COMMUNITY)
Admission: EM | Admit: 2017-06-12 | Discharge: 2017-06-12 | Disposition: A | Discharge status: Home / Self Care | Payer: Medicaid Other | Attending: Emergency Medicine | Admitting: Emergency Medicine

## 2017-06-12 ENCOUNTER — Encounter (HOSPITAL_COMMUNITY): Payer: Self-pay | Admitting: Emergency Medicine

## 2017-06-12 DIAGNOSIS — F17210 Nicotine dependence, cigarettes, uncomplicated: Secondary | ICD-10-CM | POA: Diagnosis not present

## 2017-06-12 DIAGNOSIS — N200 Calculus of kidney: Secondary | ICD-10-CM | POA: Diagnosis not present

## 2017-06-12 DIAGNOSIS — B9689 Other specified bacterial agents as the cause of diseases classified elsewhere: Secondary | ICD-10-CM | POA: Insufficient documentation

## 2017-06-12 DIAGNOSIS — I10 Essential (primary) hypertension: Secondary | ICD-10-CM | POA: Insufficient documentation

## 2017-06-12 DIAGNOSIS — R638 Other symptoms and signs concerning food and fluid intake: Secondary | ICD-10-CM | POA: Diagnosis not present

## 2017-06-12 DIAGNOSIS — N760 Acute vaginitis: Secondary | ICD-10-CM | POA: Insufficient documentation

## 2017-06-12 DIAGNOSIS — R109 Unspecified abdominal pain: Secondary | ICD-10-CM | POA: Diagnosis present

## 2017-06-12 LAB — URINALYSIS, ROUTINE W REFLEX MICROSCOPIC
BACTERIA UA: NONE SEEN
Bilirubin Urine: NEGATIVE
Glucose, UA: NEGATIVE mg/dL
KETONES UR: NEGATIVE mg/dL
Leukocytes, UA: NEGATIVE
Nitrite: NEGATIVE
PH: 5 (ref 5.0–8.0)
PROTEIN: NEGATIVE mg/dL
Specific Gravity, Urine: 1.025 (ref 1.005–1.030)

## 2017-06-12 LAB — WET PREP, GENITAL
Sperm: NONE SEEN
Trich, Wet Prep: NONE SEEN
YEAST WET PREP: NONE SEEN

## 2017-06-12 LAB — I-STAT BETA HCG BLOOD, ED (MC, WL, AP ONLY): I-stat hCG, quantitative: <5 m[IU]/mL (ref <5)

## 2017-06-12 LAB — COMPREHENSIVE METABOLIC PANEL
ALBUMIN: 3.5 g/dL (ref 3.5–5.0)
ALK PHOS: 57 U/L (ref 38–126)
ALT: 19 U/L (ref 14–54)
ANION GAP: 7 (ref 5–15)
AST: 16 U/L (ref 15–41)
BUN: 12 mg/dL (ref 6–20)
CALCIUM: 9 mg/dL (ref 8.9–10.3)
CO2: 22 mmol/L (ref 22–32)
Chloride: 108 mmol/L (ref 101–111)
Creatinine, Ser: 0.68 mg/dL (ref 0.44–1.00)
GFR calc Af Amer: >60 mL/min (ref >60)
GFR calc non Af Amer: >60 mL/min (ref >60)
GLUCOSE: 101 mg/dL — AB (ref 65–99)
Potassium: 4.1 mmol/L (ref 3.5–5.1)
SODIUM: 137 mmol/L (ref 135–145)
Total Bilirubin: 0.6 mg/dL (ref 0.3–1.2)
Total Protein: 6.9 g/dL (ref 6.5–8.1)

## 2017-06-12 LAB — LIPASE, BLOOD: Lipase: 32 U/L (ref 11–51)

## 2017-06-12 LAB — CBC
HCT: 41.5 % (ref 36.0–46.0)
HEMOGLOBIN: 13.3 g/dL (ref 12.0–15.0)
MCH: 31.6 pg (ref 26.0–34.0)
MCHC: 32 g/dL (ref 30.0–36.0)
MCV: 98.6 fL (ref 78.0–100.0)
Platelets: 384 10*3/uL (ref 150–400)
RBC: 4.21 MIL/uL (ref 3.87–5.11)
RDW: 12.2 % (ref 11.5–15.5)
WBC: 4.6 10*3/uL (ref 4.0–10.5)

## 2017-06-12 MED ORDER — STERILE WATER FOR INJECTION IJ SOLN
INTRAMUSCULAR | Status: AC
  Administered 2017-06-12: 10 mL
  Filled 2017-06-12: qty 10

## 2017-06-12 MED ORDER — METRONIDAZOLE 500 MG PO TABS: 500.0000 mg | ORAL_TABLET | Freq: Two times a day (BID) | ORAL | 0 refills | Status: AC

## 2017-06-12 MED ORDER — CEFTRIAXONE SODIUM 250 MG IJ SOLR
250.0000 mg | Freq: Once | INTRAMUSCULAR | Status: AC
  Administered 2017-06-12: 250 mg via INTRAMUSCULAR
  Filled 2017-06-12: qty 250

## 2017-06-12 MED ORDER — AZITHROMYCIN 250 MG PO TABS
1000.0000 mg | ORAL_TABLET | Freq: Once | ORAL | Status: AC
  Administered 2017-06-12: 1000 mg via ORAL
  Filled 2017-06-12: qty 4

## 2017-06-12 NOTE — ED Triage Notes (Signed)
Pt presents to ED for assessment of lower mid abdominal pain x 2 week, with nausea this morning, diarrhea, urinary frequency, and pt is sexually active.

## 2017-06-12 NOTE — ED Notes (Signed)
Pt discharged from ED; instructions provided and scripts given; Pt encouraged to return to ED if symptoms worsen and to f/u with PCP; Pt verbalized understanding of all instructions 

## 2017-06-12 NOTE — ED Provider Notes (Signed)
[facility] Provider Note  CSN: [id number] Arrival date & time: 06/12/17  1237  History   Chief Complaint Chief Complaint  Patient presents with  . Abdominal Pain    HPI Desiree Howell is a 32 y.o. female with a medical history of GERD and HTN who presented to the ED for abdominal pain x2 weeks. Patient describes as pain as constant and aching. States it is worse when she coughs and it feels cramping at that time. Patient denies any changes in the pain with eating. Denies recent changes in diet, travel or new exposures. Associated symptoms: decreased appetite. Denies fever, vaginal/pelvic pain, vaginal bleeding or discharge, dysuria, hematuria, urinary hesitancy. Denies changes in bowel habits, N/V/D/C, flank pain or back pain.  Patient has tried ibuprofen prior to coming to the ED which helps alleviate pain. Patient is monogamous and sexually active with a female, but states STDs are possible.  Past Medical History:  Diagnosis Date  . Bipolar 1 disorder ([place])   . Depression   . GERD (gastroesophageal reflux disease)    Zantac  otc  . Hypertension    no medication    Patient Active Problem List   Diagnosis Date Noted  . Mass of shoulder region 11/10/2016  . 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 CESAREAN SECTION    . EXCISION MASS UPPER EXTREMETIES Right 11/10/2016   Procedure: EXCISION MASS RIGHT SHOULDER;  Surgeon: Garald Balding, MD;  Location: [facility];  Service: Orthopedics;  Laterality: Right;     OB History   None      Home Medications    Prior to Admission medications   Medication Sig Start Date End Date Taking? Authorizing Provider  acetaminophen (TYLENOL) 500 MG tablet Take 1,000 mg by mouth every 6 (six) hours as needed for headache (pain).   Yes [provider]  ibuprofen (ADVIL,MOTRIN) 800 MG tablet Take 1 tablet (800 mg total) by mouth 3 (three) times  daily. Patient taking differently: Take 200-400 mg by mouth every 8 (eight) hours as needed for mild pain.  03/04/17  Yes Larene Pickett, PA-C  metroNIDAZOLE (FLAGYL) 500 MG tablet Take 1 tablet (500 mg total) by mouth 2 (two) times daily for 7 days. 06/12/17 06/19/17  Kayler Buckholtz, Jonelle Sports, PA-C    Family History History reviewed. No pertinent family history.  Social History Social History   Tobacco Use  . Smoking status: Light Tobacco Smoker    Types: Cigarettes  . Smokeless tobacco: Never Used  . Tobacco comment: 10 cigeretts a month  Substance Use Topics  . Alcohol use: Yes    Comment: occasionally  . Drug use: No     Allergies   Patient has no known allergies.   Review of Systems Review of Systems  Constitutional: Positive for appetite change. Negative for chills, diaphoresis and fever.  Respiratory: Negative for cough, chest tightness and shortness of breath.   Cardiovascular: Negative for chest pain, palpitations and leg swelling.  Gastrointestinal: Positive for abdominal pain. Negative for blood in stool, constipation, diarrhea, nausea and vomiting.  Endocrine: Negative.   Genitourinary: Negative for dysuria, flank pain and hematuria.  Musculoskeletal: Negative.  Negative for back pain.  Skin: Negative.   Neurological: Negative for dizziness, syncope, light-headedness, numbness and headaches.  Hematological: Negative.      Physical Exam Updated Vital Signs BP 121/78   Pulse (!) 59   Temp 98.4 F (36.9 C) (  Oral)   Resp 16   LMP 05/24/2017   SpO2 100%   Physical Exam  Constitutional: Vital signs are normal. She is cooperative. She does not have a sickly appearance.  Obese.  Eyes: Pupils are equal, round, and reactive to light. Conjunctivae, EOM and lids are normal.  Cardiovascular: Normal rate, regular rhythm, normal heart sounds and intact distal pulses.  Pulmonary/Chest: Effort normal and breath sounds normal.  Abdominal: Soft. Normal appearance and bowel  sounds are normal. There is tenderness in the suprapubic area. There is no rigidity, no guarding and no CVA tenderness.  Genitourinary: Uterus normal. Pelvic exam was performed with patient prone. Cervix exhibits no motion tenderness. Right adnexum displays no tenderness. Left adnexum displays no tenderness. No erythema, tenderness or bleeding in the vagina. Vaginal discharge found.  Genitourinary Comments: Milky white discharge appreciated.  Lymphadenopathy:       Right: No inguinal adenopathy present.       Left: No inguinal adenopathy present.  Neurological: She is alert.  Skin: Skin is warm and intact. No rash noted.  Nursing note and vitals reviewed.    ED Treatments / Results  Labs (all labs ordered are listed, but only abnormal results are displayed) Labs Reviewed  WET PREP, GENITAL - Abnormal; Notable for the following components:      Result Value   Clue Cells Wet Prep HPF POC PRESENT (*)    WBC, Wet Prep HPF POC MODERATE (*)    All other components within normal limits  COMPREHENSIVE METABOLIC PANEL - Abnormal; Notable for the following components:   Glucose, Bld 101 (*)    All other components within normal limits  URINALYSIS, ROUTINE W REFLEX MICROSCOPIC - Abnormal; Notable for the following components:   Hgb urine dipstick MODERATE (*)    All other components within normal limits  LIPASE, BLOOD  CBC  RPR  HIV ANTIBODY (ROUTINE TESTING)  I-STAT BETA HCG BLOOD, ED ([facility], [facility], [facility] ONLY)  GC/CHLAMYDIA PROBE AMP () NOT AT [facility]    EKG None  Radiology Ct Renal Stone Study  Result Date: 06/12/2017 CLINICAL DATA:  Lower abdominal pain, nausea, and urinary frequency for 2 weeks. EXAM: CT ABDOMEN AND PELVIS WITHOUT CONTRAST TECHNIQUE: Multidetector CT imaging of the abdomen and pelvis was performed following the standard protocol without IV contrast. COMPARISON:  None. FINDINGS: Lower chest: No acute findings. Hepatobiliary: No mass visualized on this unenhanced exam.  Gallbladder is unremarkable. Pancreas: No mass or inflammatory process visualized on this unenhanced exam. Spleen:  Within normal limits in size. Adrenals/Urinary tract: Tiny 1-2 mm calculus is seen in lower pole of left kidney. No evidence of ureteral calculi or hydronephrosis. Unremarkable unopacified urinary bladder. Stomach/Bowel: No evidence of obstruction, inflammatory process, or abnormal fluid collections. Normal appendix visualized. Vascular/Lymphatic: No pathologically enlarged lymph nodes identified. No evidence of abdominal aortic aneurysm. Reproductive:  No mass or other significant abnormality. Other:  None. Musculoskeletal:  No suspicious bone lesions identified. IMPRESSION: Tiny nonobstructing left renal calculus. No evidence of ureteral calculi, hydronephrosis, or other acute findings. Electronically Signed   By: Earle Gell M.D.   On: 06/12/2017 17:43    Procedures Procedures (including critical care time)  Medications Ordered in ED Medications  cefTRIAXone (ROCEPHIN) injection 250 mg (250 mg Intramuscular Given 06/12/17 1644)  azithromycin (ZITHROMAX) tablet 1,000 mg (1,000 mg Oral Given 06/12/17 1644)  sterile water (preservative free) injection (10 mLs  Given 06/12/17 1645)     Initial Impression / Assessment and Plan / ED Course  Triage vital signs and the nursing notes have been reviewed.  Pertinent labs & imaging results that were available during care of the patient were reviewed and considered in medical decision making (see chart for details).  Patient presents in no acute distress and is well appearing. Denies systemic s/s to suggest an infectious etiology. Labs and physical exam are reassuring that there is not an intra-abdominal process occurring. However, nephrolithiasis and GU etiology is possible given suprapubic pain.  Clinical Course as of Jun 13 1834  Sat Jun 12, 2017  1543 Labs WNL which is reassuring. UA not indicative of UTI. Hgb seen in blood possibly  indicative.   [GM]  1826 CT stone revealed calculi in the kidney, but no obstructions within GU tract. No evidence of inflammation or obstruction in other abdominal organs.   [GM]  1830 Wet prep + for clue cells which is indicative of BV.   [GM]    Clinical Course User Index [GM] Raylene Carmickle, Jonelle Sports, PA-C   Prophylactically treated patient for gonorrhea and chlamydia as she expresses concern about exposure to STDs. Milky white discharge seen on pelvic exam and BV confirmed with wet prep. Patient presents with no other complaints and has no other significant findings at this time that warrant additional imaging or evaluation.     Final Clinical Impressions(s) / ED Diagnoses  1. Bacterial Vaginosis. Flagyl 500mg  BID x7 days prescribed. Patient also prophylactically treated for gonorrhea and chlamydia with Rocephin and Zithormax in the ED. Education provided on safe sex practices. 2. Renal Calculi. Visualized on CT stone study within kidney, but no obstructions in GU tract. Normal kidney function today. Education provided to patient on nephrolithiasis. Education provided on s/s of passing kidney stone and appropriate treatments that can be used if that occurs.  Dispo: Home. After thorough clinical evaluation, this patient is determined to be medically stable and can be safely discharged with the previously mentioned treatment and/or outpatient follow-up/referral(s). At this time, there are no other apparent medical conditions that require further screening, evaluation or treatment.  Final diagnoses:  Renal calculi  Bacterial vaginosis    ED Discharge Orders        Ordered    metroNIDAZOLE (FLAGYL) 500 MG tablet  2 times daily     06/12/17 [address], [place], PA-C 06/12/17 1836    Carmin Muskrat, MD 06/13/17 [phone]

## 2017-06-12 NOTE — Discharge Instructions (Addendum)
Good luck in [place]!

## 2017-06-13 LAB — RPR: RPR: NONREACTIVE

## 2017-06-13 LAB — HIV ANTIBODY (ROUTINE TESTING W REFLEX): HIV SCREEN 4TH GENERATION: NONREACTIVE

## 2017-06-14 LAB — GC/CHLAMYDIA PROBE AMP (~~LOC~~) NOT AT ARMC
Chlamydia: NEGATIVE
NEISSERIA GONORRHEA: NEGATIVE

## 2019-08-04 IMAGING — CT CT SHOULDER*R* W/O CM
3 series · 14 of 33 positions shown, 17 images · non-contrast
Comparison: None.

CLINICAL DATA: Palpable right shoulder mass.

EXAM:
CT OF THE UPPER RIGHT EXTREMITY WITHOUT CONTRAST
TECHNIQUE: Multidetector CT imaging of the upper right extremity was performed
according to the standard protocol.

[Series 6: (id) st cor · coronal · 0.38mm/px · 3 of 109 slices shown]
[im 22/109  bone]
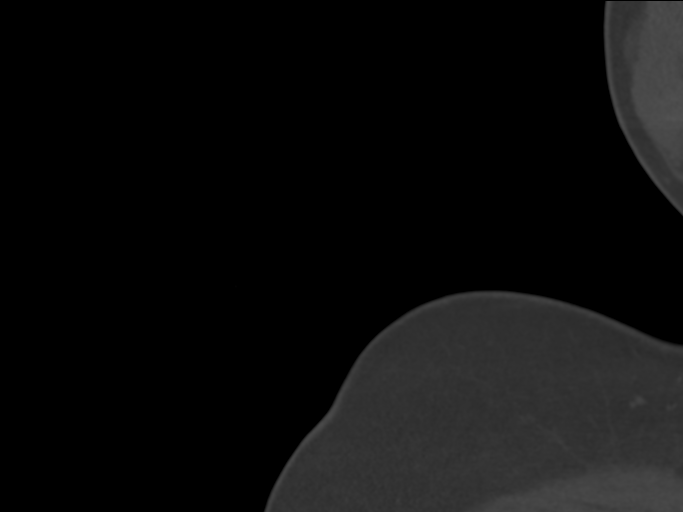
[im 44/109  bone]
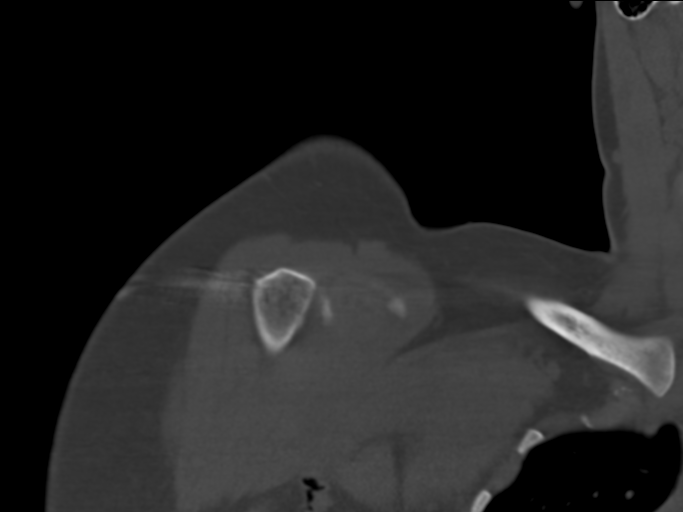
[im 65/109  bone]
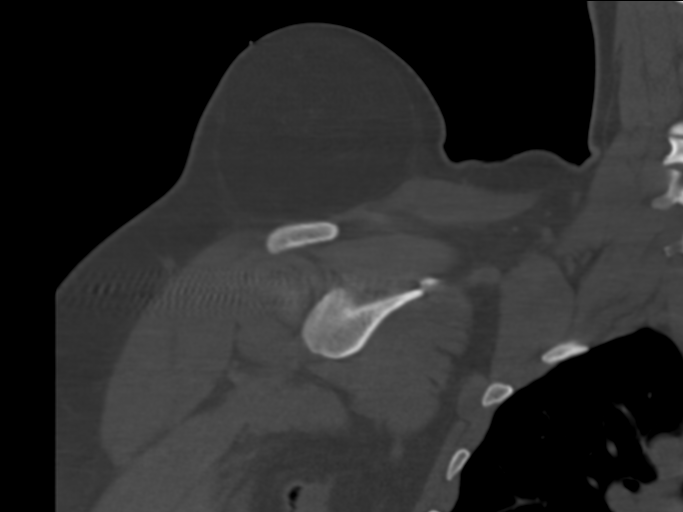

[Series 7: (id) st sag · sagittal · 0.38mm/px · 5 of 109 slices shown, 6 images]
[im 37/109  bone]
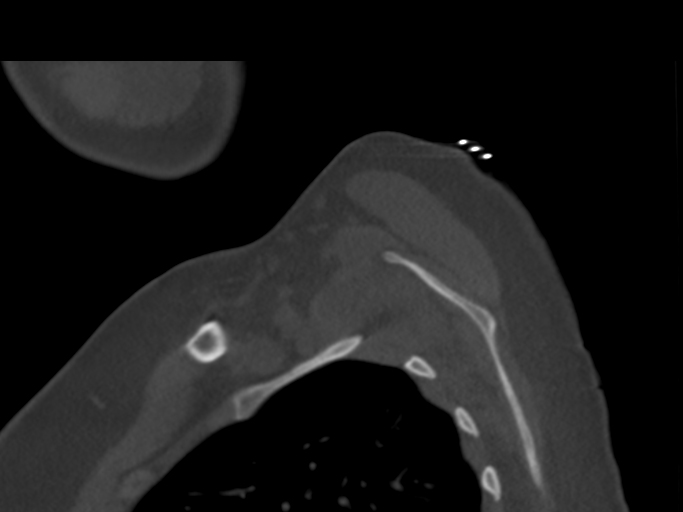
[im 46/109  bone]
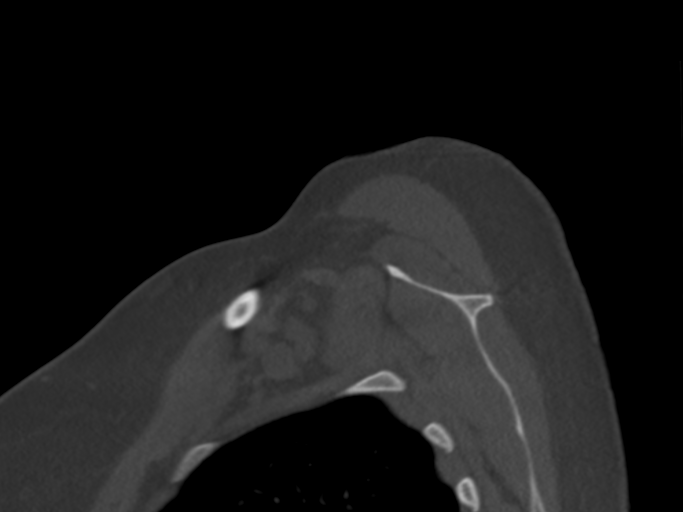
[im 55/109  soft-tissue]
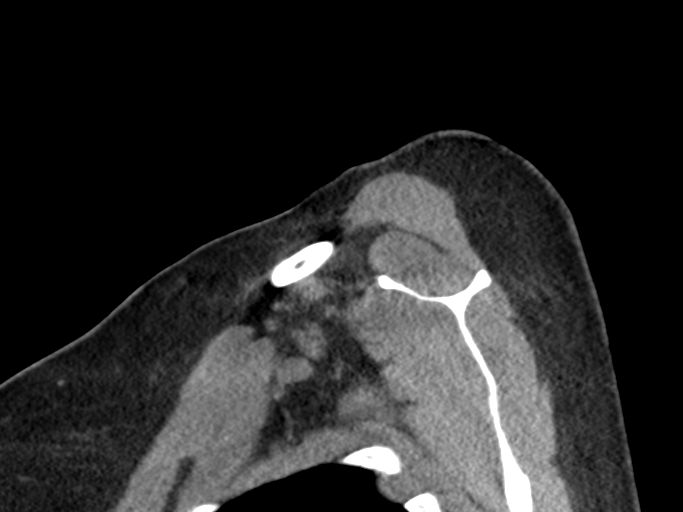
[im 55/109  bone]
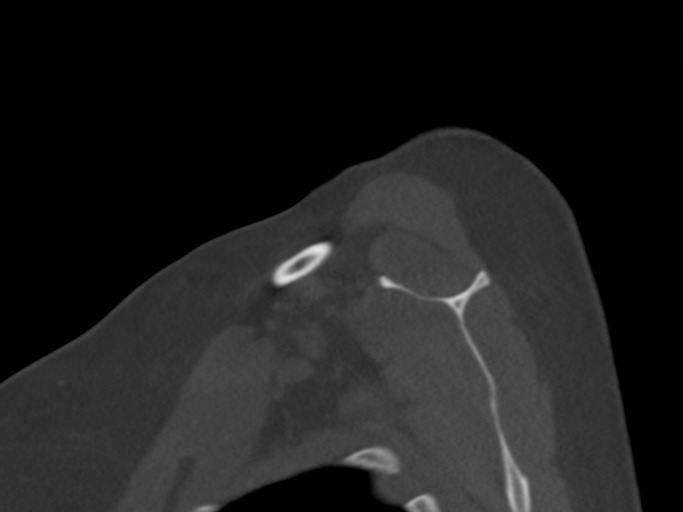
[im 64/109  bone]
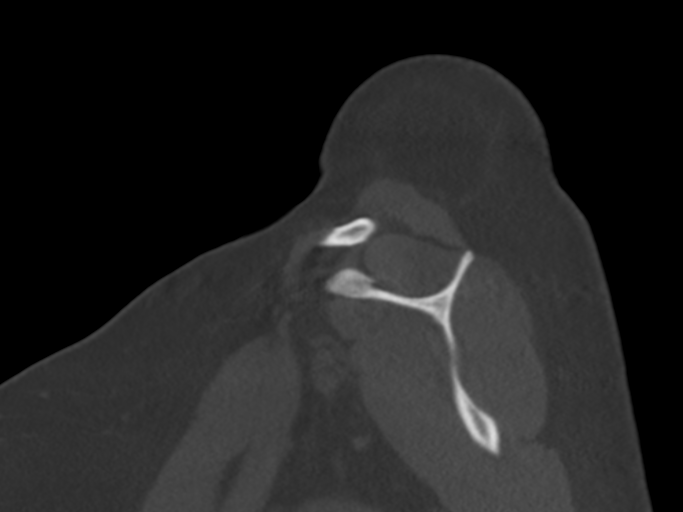
[im 73/109  bone]
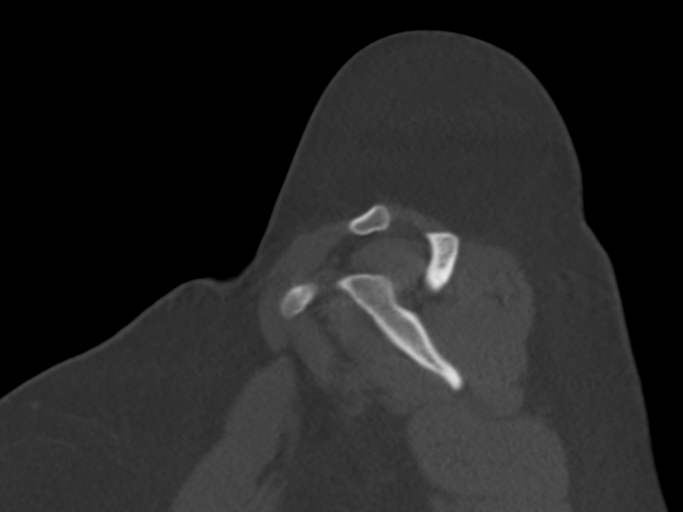

[Series 10: (id) st ax · axial · 0.45mm/px · z∈[+1318,+1468]mm · 6 of 98 slices shown, 8 images]
[im 15/98  soft-tissue]
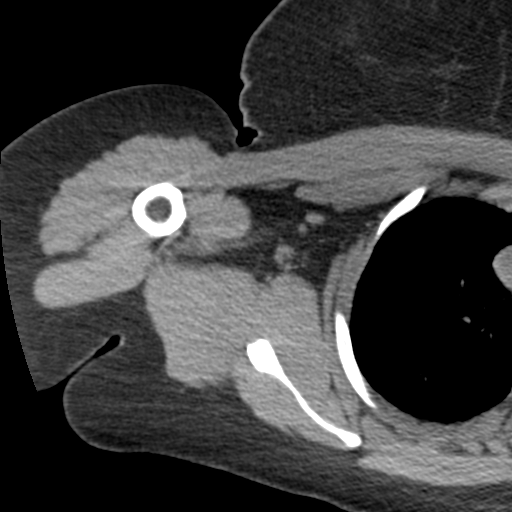
[im 15/98  bone]
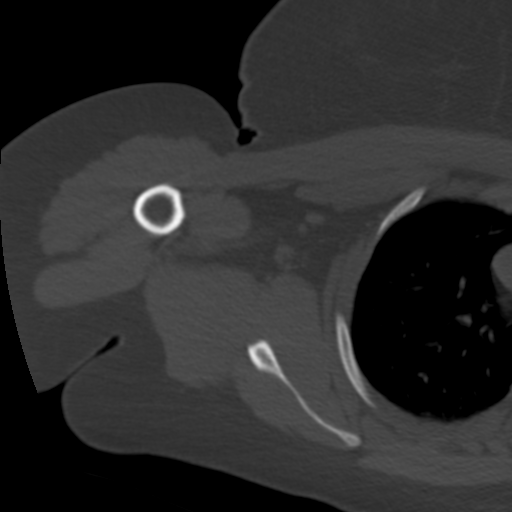
[im 30/98  bone]
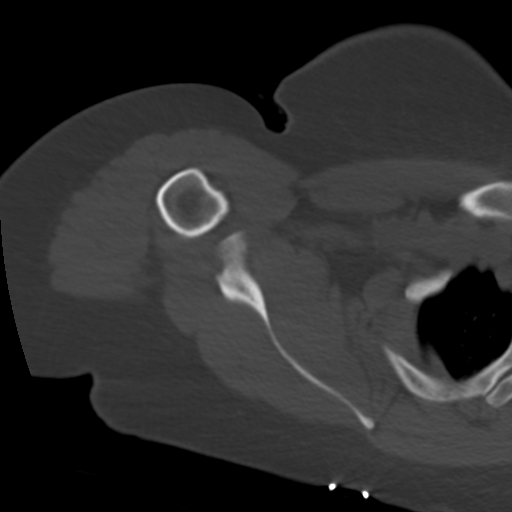
[im 45/98  bone]
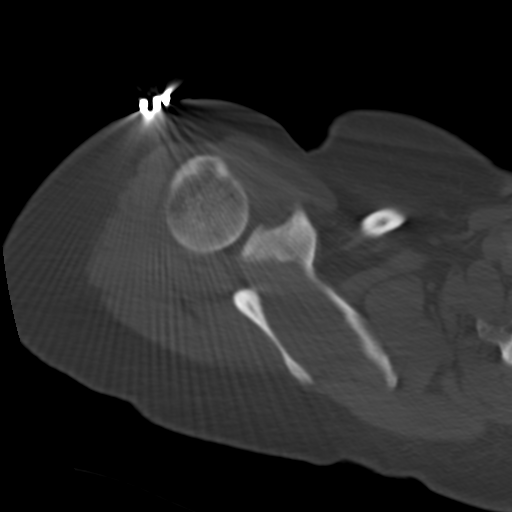
[im 60/98  bone]
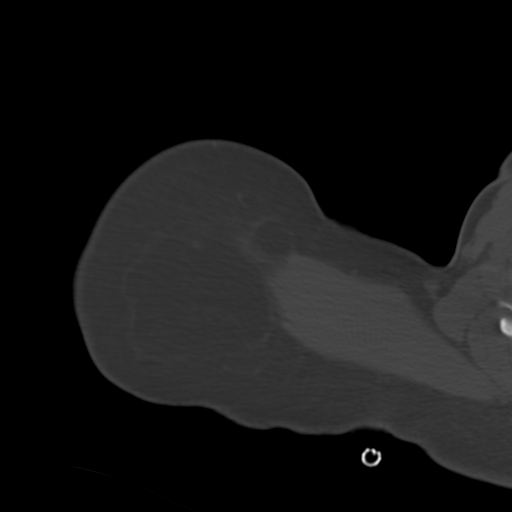
[im 75/98  soft-tissue]
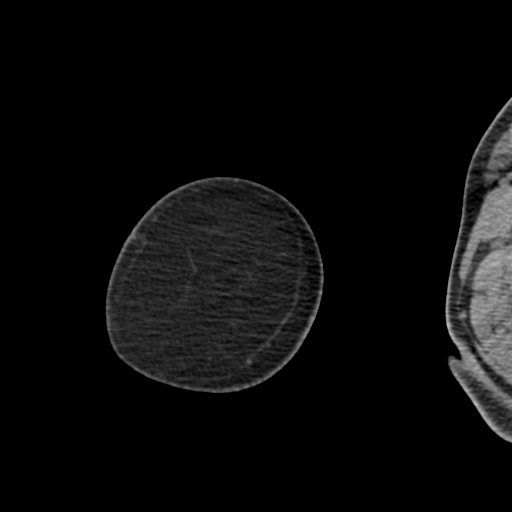
[im 75/98  bone]
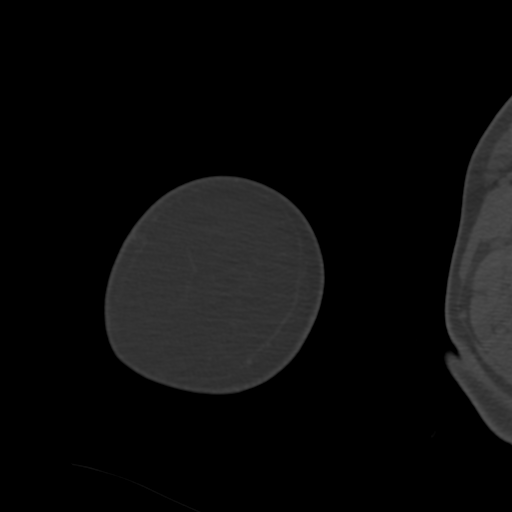
[im 90/98  bone]
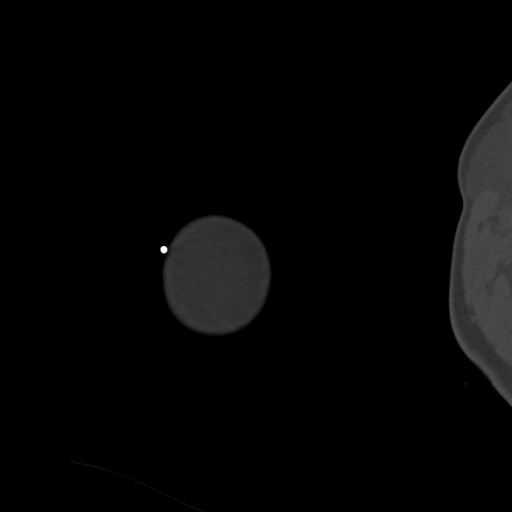

[14 of 33 positions shown; findings below may reference images not displayed]

FINDINGS: Bones/Joint/Cartilage

Normal osseous mineralization. Corticated curvilinear lucency
through the distal acromion.

Ligaments

Suboptimally assessed by CT.

Muscles and Tendons

Grossly intact.

Soft tissues

There is a fat density circumscribed mass, which corresponds to the
area of palpable concern adjacent to the right acromioclavicular
joint which measures 7.5 by 7.9 by 7.4 cm. No pathologically
enlarged by CT criteria lymph nodes.
IMPRESSION: Corticated curvilinear lucency through the distal acromion may
represent a chronic nonunion fracture or a congenital variant.

The area of palpable concern superficial to the acromioclavicular
joint corresponds to a 7.9 cm fat density circumscribed mass.
Differential diagnosis includes lipoma versus liposarcoma.

## 2020-03-28 IMAGING — CT CT RENAL STONE PROTOCOL
2 of 4 series · 16 of 46 positions shown, 18 images · non-contrast
Comparison: None.

CLINICAL DATA: Lower abdominal pain, nausea, and urinary frequency
for 2 weeks.

EXAM:
CT ABDOMEN AND PELVIS WITHOUT CONTRAST
TECHNIQUE: Multidetector CT imaging of the abdomen and pelvis was performed
following the standard protocol without IV contrast.

[Series 3: ap without · axial · non-contrast · 0.92mm/px · z∈[+782,+1182]mm · 13 of 90 slices shown, 15 images]
[im 5/90  soft-tissue]
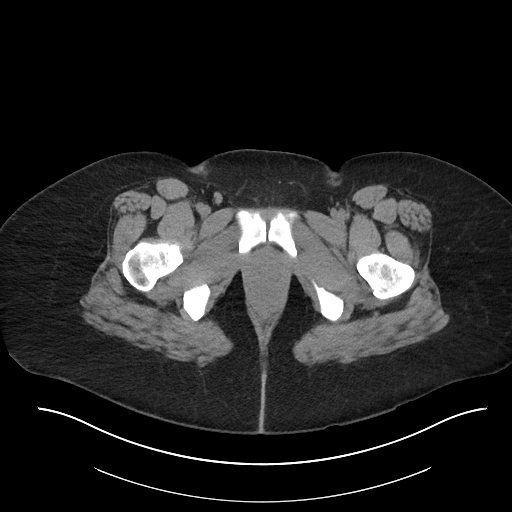
[im 5/90  bone]
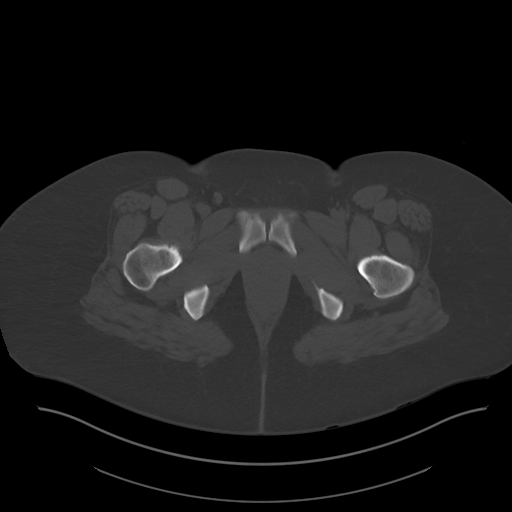
[im 10/90  soft-tissue]
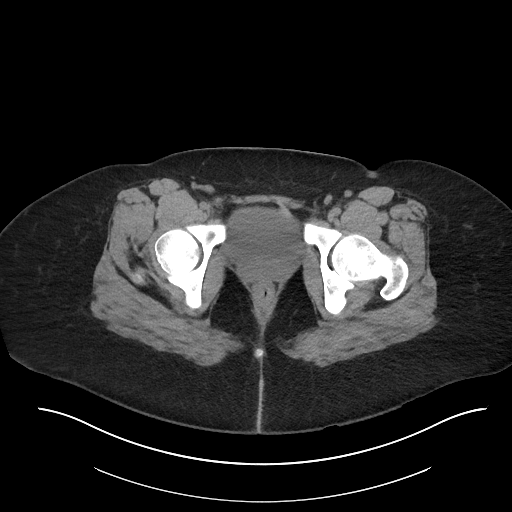
[im 20/90  soft-tissue]
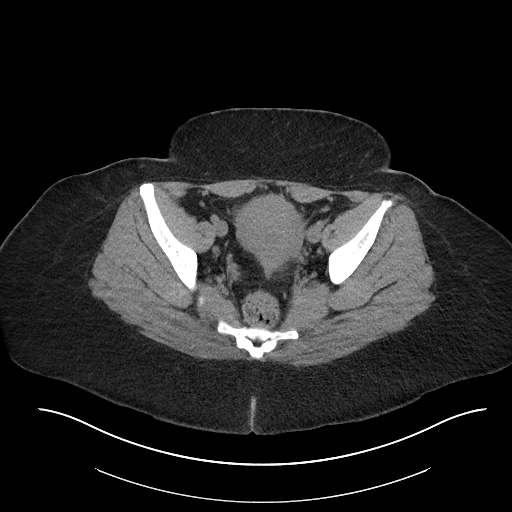
[im 25/90  soft-tissue]
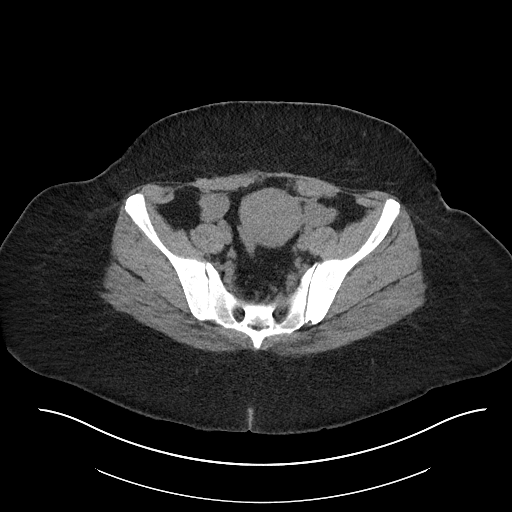
[im 30/90  soft-tissue]
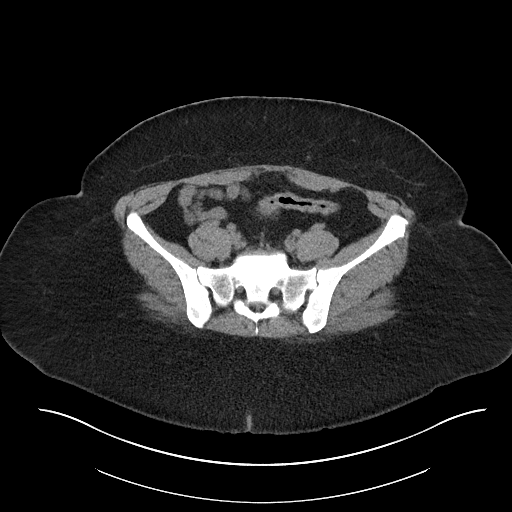
[im 40/90  soft-tissue]
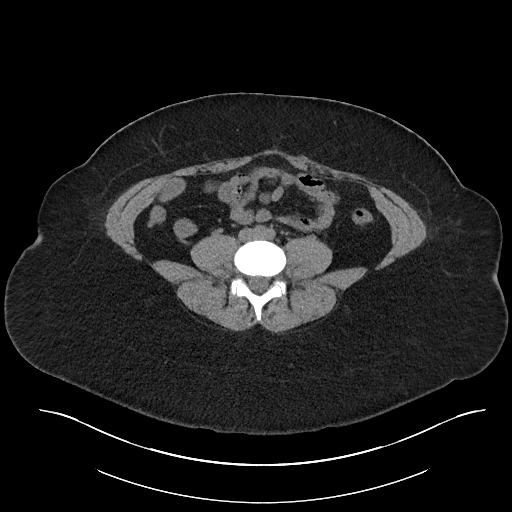
[im 45/90  soft-tissue]
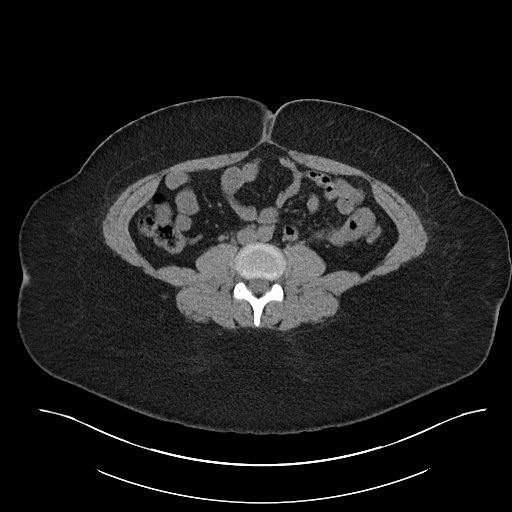
[im 50/90  soft-tissue]
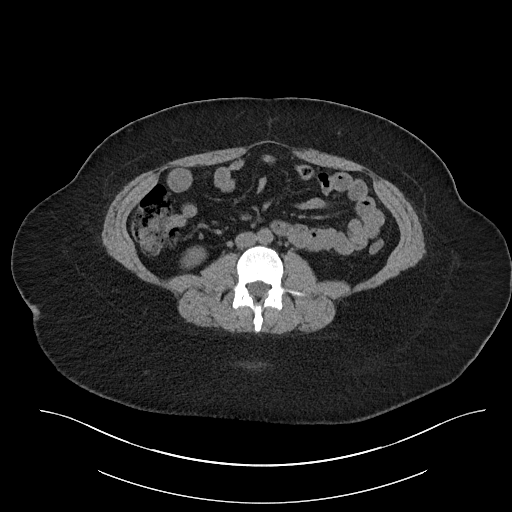
[im 60/90  soft-tissue]
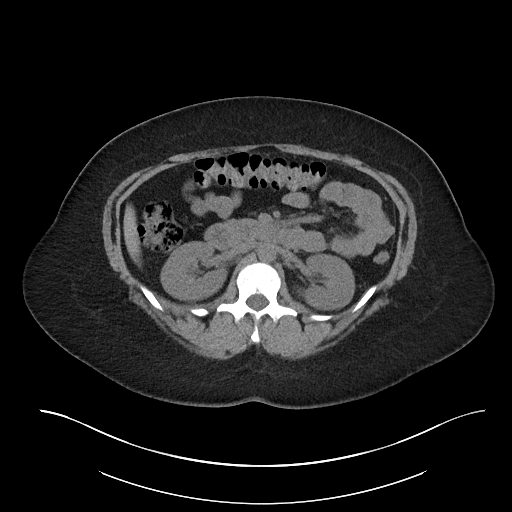
[im 60/90  bone]
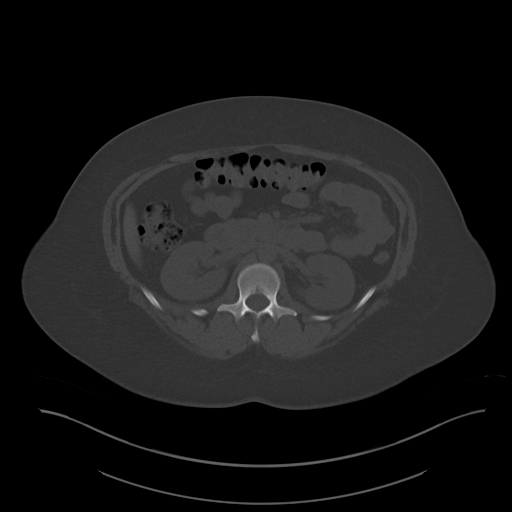
[im 65/90  soft-tissue]
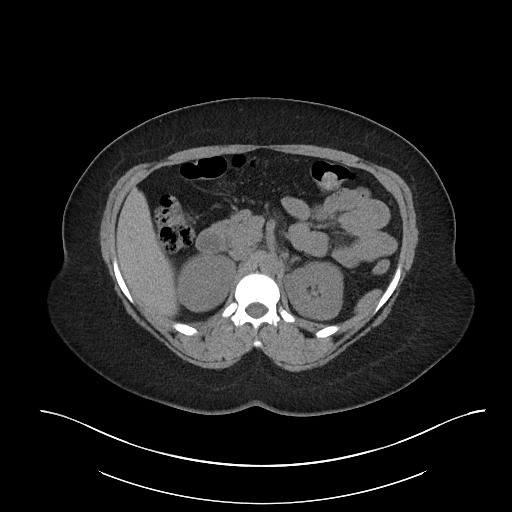
[im 70/90  soft-tissue]
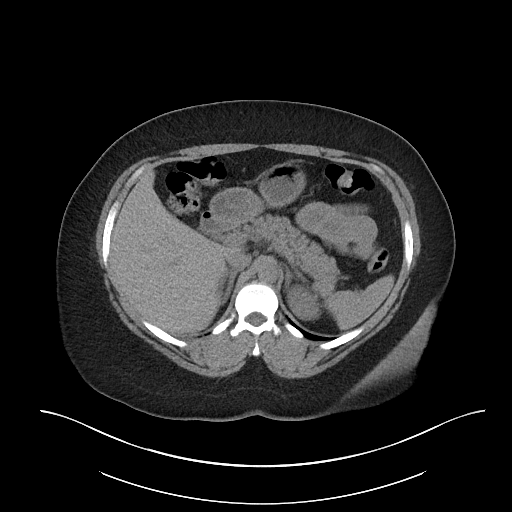
[im 80/90  soft-tissue]
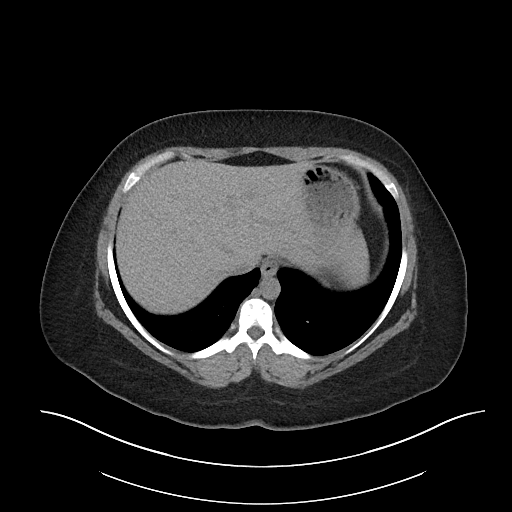
[im 85/90  soft-tissue]
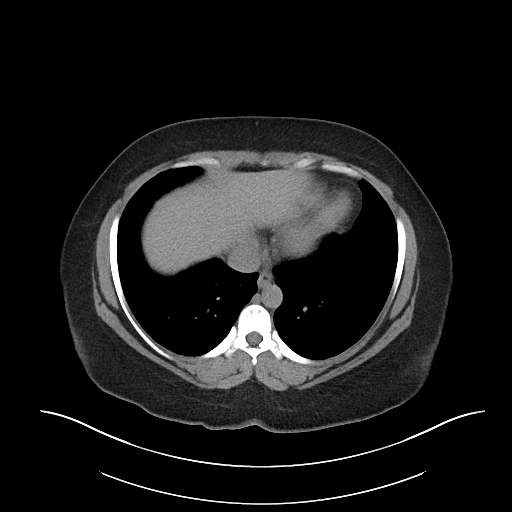

[Series 6: cor · coronal · 0.72mm/px · 3 of 69 slices shown]
[im 23/69  soft-tissue]
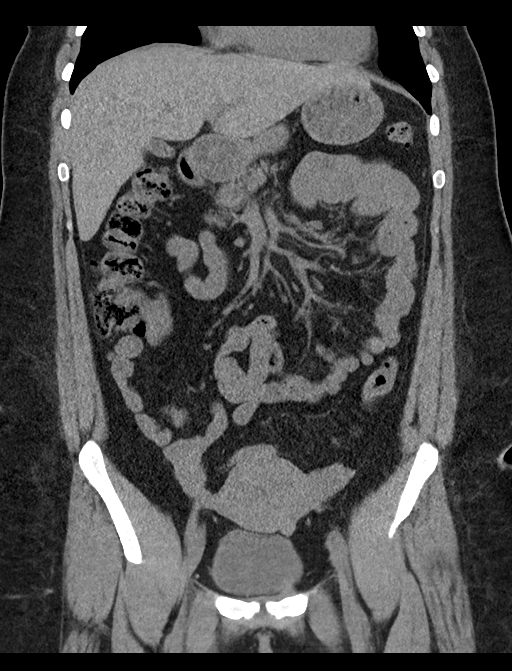
[im 31/69  soft-tissue]
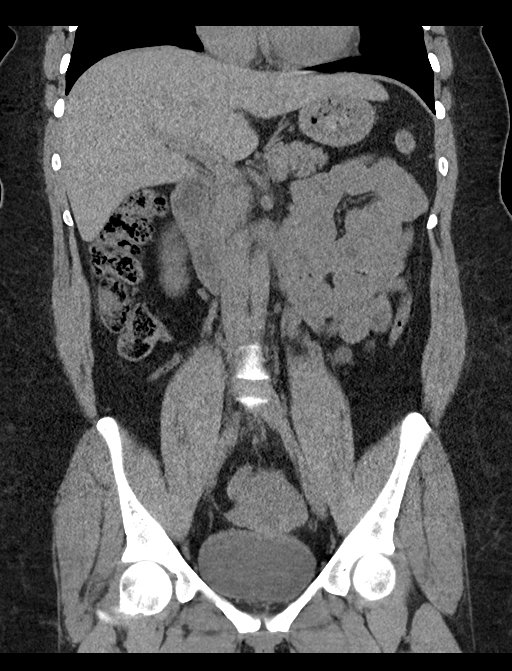
[im 38/69  soft-tissue]
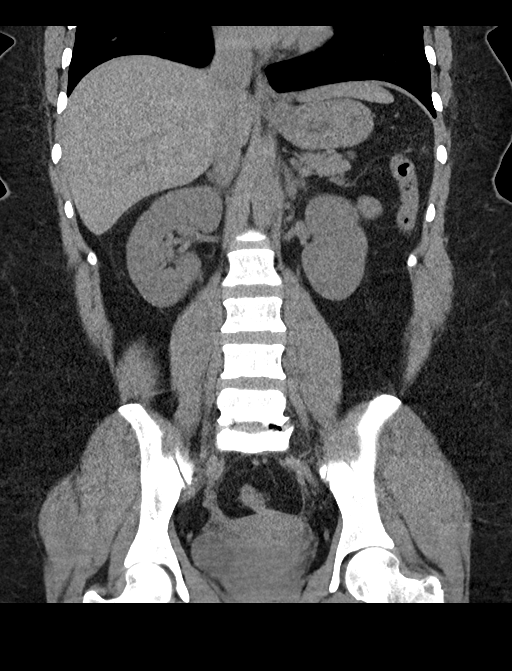

[16 of 46 positions shown; findings below may reference images not displayed]

FINDINGS: Lower chest: No acute findings.

Hepatobiliary: No mass visualized on this unenhanced exam.
Gallbladder is unremarkable.

Pancreas: No mass or inflammatory process visualized on this
unenhanced exam.

Spleen:  Within normal limits in size.

Adrenals/Urinary tract: Tiny 1-2 mm calculus is seen in lower pole
of left kidney. No evidence of ureteral calculi or hydronephrosis.
Unremarkable unopacified urinary bladder.

Stomach/Bowel: No evidence of obstruction, inflammatory process, or
abnormal fluid collections. Normal appendix visualized.

Vascular/Lymphatic: No pathologically enlarged lymph nodes
identified. No evidence of abdominal aortic aneurysm.

Reproductive:  No mass or other significant abnormality.

Other:  None.

Musculoskeletal:  No suspicious bone lesions identified.
IMPRESSION: Tiny nonobstructing left renal calculus. No evidence of ureteral
calculi, hydronephrosis, or other acute findings.
